# Patient Record
Sex: Male | Born: 1970 | Race: White | Hispanic: No | Marital: Married | Smoking: Never smoker
Health system: Southern US, Community
[De-identification: ages and names within clinical notes are randomized; demographics above are authoritative.]

## PROBLEM LIST (undated history)

## (undated) DIAGNOSIS — E785 Hyperlipidemia, unspecified: Secondary | ICD-10-CM

## (undated) DIAGNOSIS — M25569 Pain in unspecified knee: Secondary | ICD-10-CM

## (undated) DIAGNOSIS — I10 Essential (primary) hypertension: Secondary | ICD-10-CM

## (undated) DIAGNOSIS — B977 Papillomavirus as the cause of diseases classified elsewhere: Secondary | ICD-10-CM

## (undated) DIAGNOSIS — I2699 Other pulmonary embolism without acute cor pulmonale: Principal | ICD-10-CM

## (undated) HISTORY — PX: WISDOM TOOTH EXTRACTION: SHX21

## (undated) HISTORY — DX: Papillomavirus as the cause of diseases classified elsewhere: B97.7

## (undated) HISTORY — DX: Hyperlipidemia, unspecified: E78.5

## (undated) HISTORY — DX: Other pulmonary embolism without acute cor pulmonale: I26.99

## (undated) HISTORY — DX: Pain in unspecified knee: M25.569

## (undated) HISTORY — PX: HX WISDOM TEETH EXTRACTION: SHX21

## (undated) HISTORY — DX: Other pulmonary embolism without acute cor pulmonale (CMS HCC): I26.99

## (undated) HISTORY — DX: Essential (primary) hypertension: I10

---

## 2008-06-25 ENCOUNTER — Ambulatory Visit: Payer: Self-pay | Admitting: Family Medicine

## 2008-06-25 DIAGNOSIS — E785 Hyperlipidemia, unspecified: Secondary | ICD-10-CM | POA: Insufficient documentation

## 2008-06-25 DIAGNOSIS — J45991 Cough variant asthma: Secondary | ICD-10-CM | POA: Insufficient documentation

## 2008-06-25 LAB — CONVERTED CEMR LAB: LDL Cholesterol: 163 mg/dL

## 2008-06-27 LAB — CONVERTED CEMR LAB
Direct LDL: 163.2 mg/dL
HDL: 31.4 mg/dL — ABNORMAL LOW (ref >39.0)
Total CHOL/HDL Ratio: 6.8
Triglycerides: 105 mg/dL (ref 0–149)
VLDL: 21 mg/dL (ref 0–40)

## 2008-12-01 ENCOUNTER — Ambulatory Visit: Payer: Self-pay | Admitting: Family Medicine

## 2008-12-01 DIAGNOSIS — B977 Papillomavirus as the cause of diseases classified elsewhere: Secondary | ICD-10-CM | POA: Insufficient documentation

## 2008-12-01 DIAGNOSIS — M25569 Pain in unspecified knee: Secondary | ICD-10-CM | POA: Insufficient documentation

## 2008-12-01 DIAGNOSIS — L255 Unspecified contact dermatitis due to plants, except food: Secondary | ICD-10-CM | POA: Insufficient documentation

## 2009-04-21 ENCOUNTER — Ambulatory Visit: Payer: Self-pay | Admitting: Family Medicine

## 2009-04-21 DIAGNOSIS — J180 Bronchopneumonia, unspecified organism: Secondary | ICD-10-CM | POA: Insufficient documentation

## 2010-07-08 ENCOUNTER — Ambulatory Visit: Payer: Self-pay | Admitting: Family Medicine

## 2010-07-08 DIAGNOSIS — J069 Acute upper respiratory infection, unspecified: Secondary | ICD-10-CM | POA: Insufficient documentation

## 2010-09-19 ENCOUNTER — Ambulatory Visit
Admission: RE | Admit: 2010-09-19 | Discharge: 2010-09-19 | Payer: Self-pay | Source: Home / Self Care | Attending: Family Medicine | Admitting: Family Medicine

## 2010-09-19 LAB — CONVERTED CEMR LAB: Rapid Strep: NEGATIVE

## 2010-09-20 NOTE — Assessment & Plan Note (Signed)
Summary: SORE THROAT, HEAD CHEST CONT CYD   Vital Signs:  Patient profile:   40 year old male Height:      69 inches Weight:      226.75 pounds BMI:     33.61 Temp:     98.4 degrees F oral Pulse rate:   76 / minute Pulse rhythm:   regular BP sitting:   110 / 86  (left arm) Cuff size:   large  Vitals Entered By: Delilah Shan CMA Duncan Dull) (July 08, 2010 10:44 AM) CC: ST, head and chest congestion   History of Present Illness: 8 days of ST, facial pain, congestion.  Son with strep.  Pt was getting better but then ST increased.  Still with some facial pain.  Temp max 100.5, about 1.5 days ago.  No ear pain.  Minimal cough, some in AM, likely due to post nasal gtt.  Voice change.  No GI symptoms.    Used aleve occ for pain/fever.  Allergies (verified): 1)  ! Aspirin  Review of Systems       See HPI.  Otherwise negative.    Physical Exam  General:  GEN: nad, alert and oriented HEENT: mucous membranes moist, TM w/o erythema, nasal epithelium injected, OP with cobblestoning, sinuses not tender to palpation  NECK: supple w/o LA CV: rrr. PULM: ctab, no inc wob ABD: soft, +bs EXT: no edema    Impression & Recommendations:  Problem # 1:  STREP THROAT (ICD-034.0) RST pos.  Amoxil and supportive tx o/w.  follow up as needed.  He needs flu shot later this fall.  He was asking about PNA shot.  He doesn't have HTN/DM2/etc, but given his h/o PNA I advised him to d/w PMD at CPE.  he agreed.   His updated medication list for this problem includes:    Amoxicillin 875 Mg Tabs (Amoxicillin) .Marland Kitchen... 1 by mouth two times a day  Complete Medication List: 1)  Multivitamins Tabs (Multiple vitamin) .... Take 1 tablet by mouth once a day 2)  Vitamin C 500 Mg Tabs (Ascorbic acid) .... Take 1 tablet by mouth once a day 3)  Amoxicillin 875 Mg Tabs (Amoxicillin) .Marland Kitchen.. 1 by mouth two times a day  Patient Instructions: 1)  Use cough drops and gargle with warm salt water for your throat.   2)  Get  plenty of rest, drink lots of clear liquids, and use Tylenol or Ibuprofen for fever and comfort. Start the antibiotics today.  Take care.  Prescriptions: AMOXICILLIN 875 MG TABS (AMOXICILLIN) 1 by mouth two times a day  #20 x 0   Entered and Authorized by:   Crawford Givens MD   Signed by:   Crawford Givens MD on 07/08/2010   Method used:   [facility]ron[facility] to        [facility]  [address] [id number] (retail[facility]       [address]       [place], [place]  [address]       Ph: [phone] or [phone]       Fax: [phone]   RxID:   [id number]    Orders Added: 1)  Est. Patient Level III [36644]    Current Allergies (reviewed today): ! ASPIRIN  Laboratory Results  Date/Time Received: July 08, 2010 11:14 AM   Other Tests  Rapid Strep: positive

## 2010-09-28 NOTE — Assessment & Plan Note (Signed)
Summary: COUGH, CONGESTION, FEVER, SORE THROAT / LFW   Vital Signs:  Patient profile:   40 year old male Height:      69 inches Weight:      232.25 pounds BMI:     34.42 Temp:     98 degrees F oral Pulse rate:   84 / minute Pulse rhythm:   regular BP sitting:   122 / 88  (left arm) Cuff size:   large  Vitals Entered By: Delilah Shan CMA (AAMA) (September 19, 2010 9:08 AM) CC: Cough, congestion   History of Present Illness: prev RST pos and was treated.  Started antibiotics and got better after a few days.  Now with symptoms starting about 7 days ago. Chest is congested now.  ST is persistent.  H/o PNA in distant past.  +cough.  Voice change noted.  Achy.  No NAVDR.  Fever has resolved, none in 4-5 days.  Some post nasal gtt.  Mult sick contacts.   Allergies: 1)  ! Aspirin  Review of Systems       See HPI.  Otherwise negative.    Physical Exam  General:  GEN: nad, alert and oriented HEENT: mucous membranes moist, TM w/o erythema, nasal epithelium injected, OP with cobblestoning but no exudates NECK: supple w/o LA, not tender to palpation  CV: rrr. PULM: ctab, no inc wob, no wheeze, dry cough noted EXT: no edema    Impression & Recommendations:  Problem # 1:  URI (ICD-465.9) Likely viral.  ST and cough are the most troubling for patient.  See instructions.  In NAD and nontoxic.  Okay for outpatient follow up.  Call back, follow up as needed.  He agrees.  Orders: Rapid Strep (03474)  His updated medication list for this problem includes:    Hydrocodone-homatropine 5-1.5 Mg/73ml Syrp (Hydrocodone-homatropine) .Marland KitchenMarland KitchenMarland KitchenMarland Kitchen 5ml by mouth three times a day as needed for cough, sedation caution.  Complete Medication List: 1)  Multivitamins Tabs (Multiple vitamin) .... Take 1 tablet by mouth once a day 2)  Vitamin C 500 Mg Tabs (Ascorbic acid) .... Take 1 tablet by mouth once a day 3)  Nutraferrin  .... Once daily 4)  Hydrocodone-homatropine 5-1.5 Mg/62ml Syrp  (Hydrocodone-homatropine) .... 5ml by mouth three times a day as needed for cough, sedation caution.  Patient Instructions: 1)  Salt water gargles for your throat.   2)  Use the hycodan for the cough.   3)  Use nasal saline for you nasal congestion.  4)  Get plenty of rest, drink lots of clear liquids, and use Tylenol or Ibuprofen for fever and comfort.  5)  This should gradually get better.   Prescriptions: HYDROCODONE-HOMATROPINE 5-1.5 MG/5ML SYRP (HYDROCODONE-HOMATROPINE) 5ml by mouth three times a day as needed for cough, sedation caution.  #6oz x 0   Entered and Authorized by:   Crawford Givens MD   Signed by:   Crawford Givens MD on 09/19/2010   Method used:   Print then Give to Patient   RxID:   [id number]    Orders Added: 1)  Est. Patient Level III [18841] 2)  Rapid Strep [66063]    Current Allergies (reviewed today): ! ASPIRIN   Laboratory Results  Date/Time Received: September 19, 2010 9:25 AM   Other Tests  Rapid Strep: negative

## 2011-06-08 ENCOUNTER — Encounter: Payer: Self-pay | Admitting: Family Medicine

## 2011-06-08 ENCOUNTER — Ambulatory Visit (INDEPENDENT_AMBULATORY_CARE_PROVIDER_SITE_OTHER): Payer: BC Managed Care – PPO | Admitting: Family Medicine

## 2011-06-08 VITALS — BP 126/84 | HR 62 | Temp 98.5°F | Ht 69.0 in | Wt 218.8 lb

## 2011-06-08 DIAGNOSIS — J069 Acute upper respiratory infection, unspecified: Secondary | ICD-10-CM

## 2011-06-08 DIAGNOSIS — J029 Acute pharyngitis, unspecified: Secondary | ICD-10-CM

## 2011-06-08 LAB — POCT RAPID STREP A (OFFICE): Rapid Strep A Screen: NEGATIVE

## 2011-06-08 MED ORDER — GUAIFENESIN-CODEINE 100-10 MG/5ML PO SYRP: 5.0000 mL | ORAL_SOLUTION | Freq: Two times a day (BID) | ORAL | Status: AC | PRN

## 2011-06-08 MED ORDER — ALBUTEROL 90 MCG/ACT IN AERS: 2.0000 | INHALATION_SPRAY | Freq: Four times a day (QID) | RESPIRATORY_TRACT | Status: DC | PRN

## 2011-06-08 NOTE — Assessment & Plan Note (Signed)
Going on 1 wk.  Likely viral URTI. Supportive care. As mild wheezing, will treat with albuterol. cheratussin for cough at night. Update [place] if not improving as expected.

## 2011-06-08 NOTE — Progress Notes (Signed)
  Subjective:    Patient ID: Kerry Pierce, male    DOB: 18-Mar-1971, 40 y.o.   MRN: [id number]  HPI CC: cough  1 wk h/o deep cough, increased vit C.  Also with slight fever at beginning.  Then ST, drainage down back of throat started, last night starting to have pain below ears.  Wanted to get checked out.  Tmax 100.  Has tried hot teas and nasal spray ([facility] brand, but unsure what it was).  No headaches, sinus pressure pain, abd pain, n/v/d, rashes, myalgia or arthralgias.  Tends to get resp infection yearly.  Has had PNA in past, several times.  Has had flu in past as well.  No sick contacts at home, no smokers at home.  No h/o asthma, COPD.  ? Cough variant asthma.  Review of Systems Per HPI    Objective:   Physical Exam  Nursing note and vitals reviewed. Constitutional: He appears well-developed and well-nourished. No distress.  HENT:  Head: Normocephalic and atraumatic.  Right Ear: Hearing, tympanic membrane, external ear and ear canal normal.  Left Ear: Hearing, tympanic membrane, external ear and ear canal normal.  Nose: Nose normal. No mucosal edema or rhinorrhea.  Mouth/Throat: Uvula is midline, oropharynx is clear and moist and mucous membranes are normal. No oropharyngeal exudate.  Eyes: Conjunctivae and EOM are normal. Pupils are equal, round, and reactive to light. No scleral icterus.  Neck: Normal range of motion. Neck supple.  Cardiovascular: Normal rate, regular rhythm, normal heart sounds and intact distal pulses.   No murmur heard. Pulmonary/Chest: Effort normal. No respiratory distress. He has wheezes (mild). He has no rales.  Lymphadenopathy:    He has no cervical adenopathy.  Skin: Skin is warm and dry. No rash noted.  Psychiatric: He has a normal mood and affect.      Assessment & Plan:

## 2011-06-08 NOTE — Patient Instructions (Signed)
Sounds like you have a viral upper respiratory infection. Antibiotics are not needed for this.  Viral infections usually take 7-10 days to resolve.  The cough can last several weeks to go away. Use medication as prescribed: albuterol to help with cough, cheratussin for night time. Push fluids and plenty of rest. Please let us know if you are not improving as expected, or if you have high fevers (>101.5) or difficulty swallowing or worsening productive cough or shortness of breath. Call clinic with questions.  Good to see you today.

## 2011-12-14 ENCOUNTER — Ambulatory Visit: Payer: BC Managed Care – PPO | Admitting: Family Medicine

## 2011-12-14 ENCOUNTER — Encounter: Payer: Self-pay | Admitting: Family Medicine

## 2011-12-14 ENCOUNTER — Ambulatory Visit (INDEPENDENT_AMBULATORY_CARE_PROVIDER_SITE_OTHER): Payer: BC Managed Care – PPO | Admitting: Family Medicine

## 2011-12-14 VITALS — BP 124/80 | HR 64 | Temp 97.6°F | Wt 226.2 lb

## 2011-12-14 DIAGNOSIS — L255 Unspecified contact dermatitis due to plants, except food: Secondary | ICD-10-CM

## 2011-12-14 MED ORDER — PREDNISONE 20 MG PO TABS: ORAL_TABLET | ORAL | Status: DC

## 2011-12-14 NOTE — Patient Instructions (Signed)
Benadryl and oatmeal bath at night time. Look into sarna cream Prednisone course as well Let us know if not improving as expected.  Poison [employer] ivy is a inflammation of the skin (contact dermatitis) caused by touching the allergens on the leaves of the ivy plant following previous exposure to the plant. The rash usually appears 48 hours after exposure. The rash is usually bumps (papules) or blisters (vesicles) in a linear pattern. Depending on your own sensitivity, the rash may simply cause redness and itching, or it may also progress to blisters which may break open. These must be well cared for to prevent secondary bacterial (germ) infection, followed by scarring. Keep any open areas dry, clean, dressed, and covered with an antibacterial ointment if needed. The eyes may also get puffy. The puffiness is worst in the morning and gets better as the day progresses. This dermatitis usually heals without scarring, within 2 to 3 weeks without treatment. HOME CARE INSTRUCTIONS  Thoroughly wash with soap and water as soon as you have been exposed to poison ivy. You have about one half hour to remove the plant resin before it will cause the rash. This washing will destroy the oil or antigen on the skin that is causing, or will cause, the rash. Be sure to wash under your fingernails as any plant resin there will continue to spread the rash. Do not rub skin vigorously when washing affected area. Poison ivy cannot spread if no oil from the plant remains on your body. A rash that has progressed to weeping sores will not spread the rash unless you have not washed thoroughly. It is also important to wash any clothes you have been wearing as these may carry active allergens. The rash will return if you wear the unwashed clothing, even several days later. Avoidance of the plant in the future is the best measure. Poison ivy plant can be recognized by the number of leaves. Generally, poison ivy has three leaves with  flowering branches on a single stem. Diphenhydramine may be purchased over the counter and used as needed for itching. Do not drive with this medication if it makes you drowsy.Ask your caregiver about medication for children. SEEK MEDICAL CARE IF:  Open sores develop.   Redness spreads beyond area of rash.   You notice purulent (pus-like) discharge.   You have increased pain.   Other signs of infection develop (such as fever).  Document Released: 08/04/2000 Document Revised: 07/27/2011 Document Reviewed: 06/23/2009 [facility] Patient Information 2012 [place], [place].

## 2011-12-14 NOTE — Progress Notes (Signed)
  Subjective:    Patient ID: Kerry Pierce, male    DOB: [birth year], 41 y.o.   MRN: [id number]  HPI CC: skin rash  Bad reactions to poison ivy in past.  Going on 2 wks.  Mowed lawn for first time 2 wks ago.  Pruritic spreading rash on neck, abdomen, chest, arms, groin, anterior thighs.  Using analegsics (Calomine and hydrocortisone).  Has used prednisone in past.  Review of Systems Per HPI    Objective:   Physical Exam Erythematous pruritic papular and macular rash widespread - papules on left forearm, also significant and marked on anterior legs and groin, fading rash on right neck.    Assessment & Plan:

## 2011-12-14 NOTE — Assessment & Plan Note (Signed)
Discussed supportive care. Prednisone course prescribed. Update if not improving.

## 2013-10-23 ENCOUNTER — Ambulatory Visit (INDEPENDENT_AMBULATORY_CARE_PROVIDER_SITE_OTHER): Payer: BC Managed Care – PPO | Admitting: Internal Medicine

## 2013-10-23 ENCOUNTER — Encounter: Payer: Self-pay | Admitting: Internal Medicine

## 2013-10-23 VITALS — BP 126/88 | HR 82 | Temp 98.4°F | Wt 230.0 lb

## 2013-10-23 DIAGNOSIS — L237 Allergic contact dermatitis due to plants, except food: Secondary | ICD-10-CM

## 2013-10-23 DIAGNOSIS — L255 Unspecified contact dermatitis due to plants, except food: Secondary | ICD-10-CM

## 2013-10-23 MED ORDER — PREDNISONE 10 MG PO TABS: ORAL_TABLET | ORAL | Status: DC

## 2013-10-23 NOTE — Progress Notes (Signed)
Subjective:    Patient ID: Kerry Pierce, male    DOB: 10/14/1970, 43 y.o.   MRN: [id number]  HPI  Pt presents to the clinic today with c/o a rash. This started 3 days ago. The rash is on his upper and lower extremities. The rash is very itch. He has tried hydrocortisone cream and benadryl OTC with no improvement.  Review of Systems      Past Medical History  Diagnosis Date  . HLD (hyperlipidemia)   . Asthma     mild; cough variant  . 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 Pain in joint, lower leg     patello-femoral syndrome    Current Outpatient Prescriptions  Medication Sig Dispense Refill  . albuterol (PROVENTIL,VENTOLIN) 90 MCG/ACT inhaler Inhale 2 puffs into the lungs every 6 (six) hours as needed for wheezing.      . Multiple Vitamin (MULTIVITAMIN) tablet Take 1 tablet by mouth daily.        . NON FORMULARY 1 tablet daily. Nutraferrin       . vitamin C (ASCORBIC ACID) 500 MG tablet Take 500 mg by mouth daily.         No current facility-administered medications for this visit.    Allergies  Allergen Reactions  . Aspirin     REACTION: hives    Family History  Problem Relation Age of Onset  . Arthritis      family history  . Hypertension      family history  . Coronary artery disease      grandparents    History   Social History  . Marital Status: Married    Spouse Name: N/A    Number of Children: 3  . Years of Education: N/A   Occupational History  . Principal    Social History Main Topics  . Smoking status: Never Smoker   . Smokeless tobacco: Not on file  . Alcohol Use: No  . Drug Use: No  . Sexual Activity: Not on file   Other Topics Concern  . Not on file   Social History Narrative   From [employer] principal, [employer]      Wife; 3 kids      No regular exercise (<3 times/week)      Plays tennis 1/week     Constitutional: Denies fever, malaise, fatigue, headache  or abrupt weight changes.  Skin: Pt reports rash on arms and legs. Denies redness,  lesions or ulcercations.    No other specific complaints in a complete review of systems (except as listed in HPI above).  Objective:   Physical Exam   BP 126/88  Pulse 82  Temp(Src) 98.4 F (36.9 C) (Oral)  Wt 230 lb (104.327 kg)  SpO2 99% Wt Readings from Last 3 Encounters:  10/23/13 230 lb (104.327 kg)  12/14/11 226 lb 4 oz (102.626 kg)  06/08/11 218 lb 12 oz (99.224 kg)    General: Appears his stated age, well developed, well nourished in NAD. Skin: Multiple welts with erythematous bases note don bilateral arms and legs. Cardiovascular: Normal rate and rhythm. S1,S2 noted.  No murmur, rubs or gallops noted. No JVD or BLE edema. No carotid bruits noted. Pulmonary/Chest: Normal effort and positive vesicular breath sounds. No respiratory distress. No wheezes, rales or ronchi noted.      Lipid Panel     Component Value Date/Time   CHOL 215* 06/25/2008 1156  TRIG 105 06/25/2008 1156   HDL 31.4* 06/25/2008 1156   CHOLHDL 6.8 CALC 06/25/2008 1156   VLDL 21 06/25/2008 1156   LDLCALC 163 06/25/2008          Assessment & Plan:   Poison Ivy:  eRx for pred taper Continue benadryl as needed for itching  RTC as needed or if symptoms persist or worsen

## 2013-10-23 NOTE — Patient Instructions (Addendum)
Poison Ivy Poison ivy is a inflammation of the skin (contact dermatitis) caused by touching the allergens on the leaves of the ivy plant following previous exposure to the plant. The rash usually appears 48 hours after exposure. The rash is usually bumps (papules) or blisters (vesicles) in a linear pattern. Depending on your own sensitivity, the rash may simply cause redness and itching, or it may also progress to blisters which may break open. These must be well cared for to prevent secondary bacterial (germ) infection, followed by scarring. Keep any open areas dry, clean, dressed, and covered with an antibacterial ointment if needed. The eyes may also get puffy. The puffiness is worst in the morning and gets better as the day progresses. This dermatitis usually heals without scarring, within 2 to 3 weeks without treatment. HOME CARE INSTRUCTIONS  Thoroughly wash with soap and water as soon as you have been exposed to poison ivy. You have about one half hour to remove the plant resin before it will cause the rash. This washing will destroy the oil or antigen on the skin that is causing, or will cause, the rash. Be sure to wash under your fingernails as any plant resin there will continue to spread the rash. Do not rub skin vigorously when washing affected area. Poison ivy cannot spread if no oil from the plant remains on your body. A rash that has progressed to weeping sores will not spread the rash unless you have not washed thoroughly. It is also important to wash any clothes you have been wearing as these may carry active allergens. The rash will return if you wear the unwashed clothing, even several days later. Avoidance of the plant in the future is the best measure. Poison ivy plant can be recognized by the number of leaves. Generally, poison ivy has three leaves with flowering branches on a single stem. Diphenhydramine may be purchased over the counter and used as needed for itching. Do not drive with  this medication if it makes you drowsy.Ask your caregiver about medication for children. SEEK MEDICAL CARE IF:  Open sores develop.  Redness spreads beyond area of rash.  You notice purulent (pus-like) discharge.  You have increased pain.  Other signs of infection develop (such as fever). Document Released: 08/04/2000 Document Revised: 10/30/2011 Document Reviewed: 06/23/2009 [employer] Patient Information 2014 [employer].  

## 2013-10-23 NOTE — Progress Notes (Signed)
Pre visit review using our clinic review tool, if applicable. No additional management support is needed unless otherwise documented below in the visit note. 

## 2013-10-30 ENCOUNTER — Ambulatory Visit: Payer: BC Managed Care – PPO | Admitting: Internal Medicine

## 2013-10-30 ENCOUNTER — Telehealth: Payer: Self-pay

## 2013-10-30 NOTE — Telephone Encounter (Signed)
Pt left v/m;pt was seen 10/23/13 with poison ivy and pt finished prednisone 2 days ago but poison ivy still spreading onto hands. Spoke with pt and he scheduled appt today with Nicki Reaperegina Baity NP.

## 2014-05-12 LAB — ENTER/EDIT EXTERNAL COMMON LAB RESULTS
HCT: 40.5
HGB: 13.6
MONOCYTES: 11.4 %
PLATELET COUNT: 262
RBC: 4.77
WBC: 17.9

## 2014-05-13 ENCOUNTER — Ambulatory Visit: Payer: BC Managed Care – PPO | Admitting: Internal Medicine

## 2014-05-13 ENCOUNTER — Ambulatory Visit (INDEPENDENT_AMBULATORY_CARE_PROVIDER_SITE_OTHER): Payer: BC Managed Care – PPO | Admitting: Family Medicine

## 2014-05-13 ENCOUNTER — Encounter: Payer: Self-pay | Admitting: Family Medicine

## 2014-05-13 ENCOUNTER — Ambulatory Visit: Payer: BC Managed Care – PPO | Admitting: Family Medicine

## 2014-05-13 VITALS — BP 124/78 | HR 87 | Temp 98.6°F | Ht 68.5 in | Wt 224.5 lb

## 2014-05-13 DIAGNOSIS — I2699 Other pulmonary embolism without acute cor pulmonale: Secondary | ICD-10-CM

## 2014-05-13 DIAGNOSIS — Z7901 Long term (current) use of anticoagulants: Secondary | ICD-10-CM | POA: Insufficient documentation

## 2014-05-13 HISTORY — DX: Other pulmonary embolism without acute cor pulmonale: I26.99

## 2014-05-13 MED ORDER — ALBUTEROL SULFATE HFA 108 (90 BASE) MCG/ACT IN AERS: 2.0000 | INHALATION_SPRAY | Freq: Four times a day (QID) | RESPIRATORY_TRACT | Status: DC | PRN

## 2014-05-13 NOTE — Progress Notes (Signed)
Dr. Karleen Hampshire T. Rosann Gorum, MD, CAQ [facility] [address] [place], [address] Phone: [phone] Fax: [phone]  05/13/2014  Patient: Kerry Pierce, MRN: [id number], DOB: Jul 04, 1971, 43 y.o.  Primary Physician:  Hannah Beat, MD  Chief Complaint: Hospitalization Follow-up  Subjective:   Kerry Pierce is a 43 y.o. pleasant patient who presents with the following:  Transitional care note:  Date of Admission: 05/10/2014 Date of Discharge: 05/12/2014  Reason for admission: Bilateral pulmonary embolism with pulmonary infarction.  The patient and his wife report that he was in his usual good state of health until several days before admission at [facility]. He had some scapular pain in his back and he started to develop some pleuritic chest pain and pain with taking a deep breath on both the left and right side and he also developed some shortness of breath. He has not really been having any kind of cough or fever.  One week ago he thinks that he may have developed some left calf pain without any current swelling, erythema, it resolved within one day. He thought that he might have pulled a muscle in his leg in hindsight. He has had no extended travel. He has no family history of a hypercoagulable disorder. No family history of pulmonary embolism or DVT that he knows of.   He had a markedly elevated d-dimer and fibrinogen on admission to the hospital during his evaluation. Subsequently, they obtained a CT angiogram of the patient's chest. This showed numerous bilateral filling defects in the pulmonary arterial system. Occlusive embolus involving the arteries supplying the anterior segment of the left lower lobe posterior left lower lobe it is difficult to assess. Consolidation in this region. On the right there is occlusive thrombus and artery extending to the right apex. There is also partially occlusive thrombus in the arteries supplying  the anterior right middle lobe multiple lower lobe register partially occlusive to occlusive thrombus. There is consolidation in the inferior lingula, posterior left lower lobe and posterior lateral right lobe. There is a tiny left pleural effusion.  Additionally, there is a 7 mm pulmonary nodule in the right middle lobe. Radiology impression is bilateral pulmonary emboli with multifocal bilateral consolidation, suspicious for pulmonary infarction.  He was admitted to a monitored bed and anticoagulated with Lovenox. He was also placed on Coumadin.  Patient Active Problem List   Diagnosis Date Noted  . 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 Pulmonary infarction 05/13/2014    Priority: High  . Chronic anticoagulation 05/13/2014  . 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 HYPERLIPIDEMIA 06/25/2008  . 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 HLD (hyperlipidemia)   . Asthma     mild; cough variant  . 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 Pain in joint, lower leg     patello-femoral syndrome  . 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 Pulmonary infarction 05/13/2014   Past Surgical History  Procedure Laterality Date  . Wisdom tooth extraction     History   Social History  . Marital Status: Married    Spouse Name: N/A    Number of Children: 3  . Years of Education: N/A   Occupational History  . Principal    Social History Main Topics  . Smoking status: Never Smoker   . Smokeless tobacco: Not on file  .  Alcohol Use: No  . Drug Use: No  . Sexual Activity: Not on file   Other Topics Concern  . 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  Arthritis      family history  . Hypertension      family history  . Coronary artery disease      grandparents   Allergies  Allergen Reactions  . Aspirin     REACTION: hives   Medication list has been reviewed and updated.   GEN: As above. no fevers, chills. GI: No n/v/d, eating normally Pulm: + SOB Chest pain with breathing. Interactive and getting along well at home.  Otherwise, ROS is as per the HPI. Objective:   BP 124/78  Pulse 87  Temp(Src) 98.6 F (37 C) (Oral)  Ht 5' 8.5" (1.74 m)  Wt 224 lb 8 oz (101.833 kg)  BMI 33.63 kg/m2  SpO2 94%   GEN: WDWN, NAD, Non-toxic, A & O x 3 HEENT: Atraumatic, Normocephalic. Neck supple. No masses, No LAD. Ears and Nose: No external deformity. CV: RRR, No M/G/R. No JVD. No thrill. No extra heart sounds. PULM: CTA B, no wheezes, crackles, rhonchi. No retractions. No resp. distress. No accessory muscle use. EXTR: No c/c/e NEURO Normal gait.  PSYCH: Normally interactive. Conversant. Not depressed or anxious appearing.  Calm demeanor.   Laboratory and Imaging Data: All [facility] studies and images reviewed.  Assessment and Plan:   Embolism, pulmonary with infarction - Plan: Ambulatory referral to Pulmonology  Embolism, pulmonary with infarction, 05/10/2014  Pulmonary infarction  Chronic anticoagulation  The patient has a hypercoagulable workup is pending at [facility]. The length of his anticoagulation will depend on this hypercoagulable workup. He would need at least a full 12 months of anticoagulation with Coumadin.  Given the extent of this pulmonary embolism with infarction of both lungs, I really would like to have pulmonology involved with the management of his lung status over the upcoming months. Thankfully, they are going to see him in one week's time. I appreciate their assistance.  He will be coming tomorrow to establish with our Coumadin clinic in our office.  I did my best answer all the patient's  questions and his wife's questions.  Patient seen within 1 day of discharge face to face.  New Prescriptions   ALBUTEROL (PROVENTIL HFA;VENTOLIN HFA) 108 (90 BASE) MCG/ACT INHALER    Inhale 2 puffs into the lungs every 6 (six) hours as needed for wheezing or shortness of breath.   Orders Placed This Encounter  Procedures  . Ambulatory referral to Pulmonology    Signed,  Karleen Hampshire T. Naya Ilagan, MD   Patient's Medications  New Prescriptions   ALBUTEROL (PROVENTIL HFA;VENTOLIN HFA) 108 (90 BASE) MCG/ACT INHALER    Inhale 2 puffs into the lungs every 6 (six) hours as needed for wheezing or shortness of breath.  Previous Medications   ENOXAPARIN (LOVENOX) 100 MG/ML INJECTION    1 mL every 12 (twelve) hours.   MULTIPLE VITAMIN (MULTIVITAMIN) TABLET    Take 1 tablet by mouth daily.     OXYCODONE (OXY IR/ROXICODONE) 5 MG IMMEDIATE RELEASE TABLET    Take 1 tablet by mouth every 4 (four) hours as needed.  VITAMIN C (ASCORBIC ACID) 500 MG TABLET    Take 500 mg by mouth daily.     WARFARIN (COUMADIN) 5 MG TABLET    Take 1 tablet by mouth at bedtime.  Modified Medications   No medications on file  Discontinued Medications   ALBUTEROL (PROVENTIL,VENTOLIN) 90 MCG/ACT INHALER    Inhale 2 puffs into the lungs every 6 (six) hours as needed for wheezing.   NON FORMULARY    1 tablet daily. Nutraferrin    PREDNISONE (DELTASONE) 10 MG TABLET    Take 3 tabs on days 1-2, take 2 tabs on days 3-4, take 1 tab on days 5-6

## 2014-05-13 NOTE — Patient Instructions (Signed)
F/u tomorrow to establish Coumadin clinic visit with [facility].   REFERRALS TO SPECIALISTS, SPECIAL TESTS (MRI, CT, ULTRASOUNDS)  [facility] or [facility] will help you. ASK CHECK-IN FOR HELP.  Imaging / Special Testing referrals sometimes can be done same day if EMERGENCY, but others can take 2 or 3 days to get an appointment. Starting in 2015, many of the new Medicare plans and Obamacare plans take much longer.   Specialist appointment times vary a great deal, based on their schedule / openings. -- Some specialists have very long wait times. (Example. Dermatology. Multiple months  for non-cancer)

## 2014-05-13 NOTE — Progress Notes (Signed)
Pre visit review using our clinic review tool, if applicable. No additional management support is needed unless otherwise documented below in the visit note. 

## 2014-05-14 ENCOUNTER — Ambulatory Visit (INDEPENDENT_AMBULATORY_CARE_PROVIDER_SITE_OTHER): Payer: BC Managed Care – PPO | Admitting: Family Medicine

## 2014-05-14 DIAGNOSIS — Z7901 Long term (current) use of anticoagulants: Secondary | ICD-10-CM

## 2014-05-14 LAB — POCT INR: INR: 1.2

## 2014-05-18 ENCOUNTER — Ambulatory Visit (INDEPENDENT_AMBULATORY_CARE_PROVIDER_SITE_OTHER): Payer: BC Managed Care – PPO | Admitting: *Deleted

## 2014-05-18 DIAGNOSIS — Z7901 Long term (current) use of anticoagulants: Secondary | ICD-10-CM

## 2014-05-18 LAB — POCT INR: INR: 2

## 2014-05-20 ENCOUNTER — Telehealth: Payer: Self-pay

## 2014-05-20 ENCOUNTER — Ambulatory Visit (INDEPENDENT_AMBULATORY_CARE_PROVIDER_SITE_OTHER): Payer: BC Managed Care – PPO | Admitting: Internal Medicine

## 2014-05-20 ENCOUNTER — Encounter: Payer: Self-pay | Admitting: Internal Medicine

## 2014-05-20 VITALS — BP 128/84 | HR 77 | Ht 69.0 in | Wt 227.0 lb

## 2014-05-20 DIAGNOSIS — R0602 Shortness of breath: Secondary | ICD-10-CM

## 2014-05-20 DIAGNOSIS — R0609 Other forms of dyspnea: Secondary | ICD-10-CM

## 2014-05-20 DIAGNOSIS — R911 Solitary pulmonary nodule: Secondary | ICD-10-CM | POA: Insufficient documentation

## 2014-05-20 DIAGNOSIS — R0683 Snoring: Secondary | ICD-10-CM

## 2014-05-20 DIAGNOSIS — R0989 Other specified symptoms and signs involving the circulatory and respiratory systems: Secondary | ICD-10-CM

## 2014-05-20 DIAGNOSIS — I2699 Other pulmonary embolism without acute cor pulmonale: Secondary | ICD-10-CM

## 2014-05-20 NOTE — Assessment & Plan Note (Signed)
Pulmonary embolism-non-massive Most likely provoke embolism secondary to immobilization. Etiology likely lower extremity clot that migrated to the pulmonary arterial system, given the history of prolonged driving over a two-week period. This is a first time thromboembolic event, in a hemodynamically stable patient with normal RV strain-recommended therapy would be 3 months of anticoagulation. Current anticoagulation regiment-warfarin (manage per primary care physician) Patient is a candidate for Xarelto, this was discussed with the patient, since he is on warfarin he will further discuss with his primary care physician. Genetic testing for thromboembolic disease initiated during his inpatient hospitalization, primary care physician currently awaiting results. Based on results may need followup with hematology/oncology. Recommend repeat CT chest scan in 6 months (this will also evaluate the right middle lobe 7 mm nodule).

## 2014-05-20 NOTE — Progress Notes (Signed)
Date: 05/20/2014  MRN# [id number] Kerry Pierce 1971-04-05  Referring Physician:   Rishit Burkhalter is a 43 y.o. old male seen in consultation for pulmonary embolism  CC:  Chief Complaint  Patient presents with  . Advice Only    Hospitalized at [facility] last week for pulmonary embolisms.  Pt c/o SOB with exertion, some soreness in chest with heavy breathing.     HPI:  43 year old male seen in consultation for recent diagnosis of pulmonary embolism in right middle lobe nodule. He is accompanied by his wife today. She presented to [facility] on 05/10/2014 with a complaint of chest pain and acute worsening shortness of breath with chest pain radiating to the back. At that time he had a cardiac workup done in the CTA of his chest which showed that he had a right upper lobe, right middle lobe, left lower lobe pulmonary embolism; no saddle embolism, no main artery embolism. Incidentally, he was also found to have a 7 mm right middle lobe nodule. During hospitalization he was treated with Lovenox, and transition to warfarin. He was discharged on 05/12/2014, had followup with his primary care physician for further recommended him to pulmonary. He is currently on warfarin for his pulmonary embolism. Per review of records he also carries a diagnosis of cough variant asthma.   History from patient as follow: He stated in June of 2015 him and his family took a two-week vacation, this involves driving to [place], [place], other [place] states, in [place]: During that two-week period the longest time he had was 8 hours continuously. He stated 2-3 weeks prior to being admitted to the hospital he started noticing worsening shortness of breath especially when playing tennis, this was a new finding for him. Stated that after his two-week vacation he did have some mild left leg pain but it eventually went away, he initially thought this was due to a pulled muscle. He is a never smoker ,  currently employed as a principal at a local high school, only medication he takes is a multivitamin (prior to hospital admission), no clotting disorders in his family. During his hospitalization he had a 2-D echo performed which showed no RV strain, EF 55-60%. Patient also had lower extremity Doppler ultrasound, which showed no clots.  Obstructive Sleep Apnea Screening The patient was screened with the STOP-BANG questionnaire. >3 positive responses is considered a positive screen  Snoring = yes Tiredness = no Observed Apnea= yes Pressure (HTN) = no BMI >35 = NO (33.5) Age > 50 = NO Neck >17" = yes Gender (male)= yes  Total: 4/8   Screen: Positive     PMHX:   Past Medical History  Diagnosis Date  . HLD (hyperlipidemia)   . Asthma     mild; cough variant  . 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 Pain in joint, lower leg     patello-femoral syndrome  . 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 Wisdom tooth extraction     Family Hx:  Family History  Problem Relation Age of Onset  . Arthritis      family history  . Hypertension      family history  . Coronary artery disease      grandparents  . Cancer Maternal Grandmother     lung   Social Hx:   History  Substance Use Topics  .  Smoking status: Never Smoker   . Smokeless tobacco: Never Used  . Alcohol Use: No   Medication:   Current Outpatient Rx  Name  Route  Sig  Dispense  Refill  . albuterol (PROVENTIL HFA;VENTOLIN HFA) 108 (90 BASE) MCG/ACT inhaler   Inhalation   Inhale 2 puffs into the lungs every 6 (six) hours as needed for wheezing or shortness of breath.   1 Inhaler   2   . Multiple Vitamin (MULTIVITAMIN) tablet   Oral   Take 1 tablet by mouth daily.           Marland Kitchen oxyCODONE (OXY IR/ROXICODONE) 5 MG immediate release tablet   Oral   Take 1 tablet  by mouth every 4 (four) hours as needed.         . vitamin C (ASCORBIC ACID) 500 MG tablet   Oral   Take 500 mg by mouth daily.           Marland Kitchen warfarin (COUMADIN) 5 MG tablet   Oral   Take 1 tablet by mouth at bedtime. 1.5 tablets on Tuesday, Thursday, Saturday, Sunday, and 1 tablet Monday, Wednesday, Friday             Allergies:  Aspirin  Review of Systems: Gen:  Denies  fever, sweats, chills HEENT: Denies blurred vision, double vision, ear pain, eye pain, hearing loss, nose bleeds, sore throat Cvc:  No dizziness, chest pain or heaviness Resp:   Denies cough or sputum porduction. Admits to mild shortness of breath Gi: Denies swallowing difficulty, stomach pain, nausea or vomiting, diarrhea, constipation, bowel incontinence Gu:  Denies bladder incontinence, burning urine Ext:   No Joint pain, stiffness or swelling Skin: No skin rash, easy bruising or bleeding or hives Endoc:  No polyuria, polydipsia , polyphagia or weight change Psych: No depression, insomnia or hallucinations  Other:  All other systems negative  Physical Examination:   VS: BP 128/84  Pulse 77  Ht 5\' 9"  (1.753 m)  Wt 227 lb (102.967 kg)  BMI 33.51 kg/m2  SpO2 97%  General Appearance: No distress  Neuro:without focal findings, mental status, speech normal, alert and oriented, cranial nerves 2-12 intact, reflexes normal and symmetric, sensation grossly normal  HEENT: PERRLA, EOM intact, no ptosis, no other lesions noticed; Mallampati =3 Pulmonary: normal breath sounds., diaphragmatic excursion normal.No wheezing, No rales;   Sputum Production:   CardiovascularNormal S1,S2.  No m/r/g.  Abdominal aorta pulsation normal.    Abdomen: Benign, Soft, non-tender, No masses, hepatosplenomegaly, No lymphadenopathy Renal:  No costovertebral tenderness  GU:  No performed at this time. Endoc: No evident thyromegaly, no signs of acromegaly or Cushing features Skin:   warm, no rashes, no ecchymosis  Extremities:  normal, no cyanosis, clubbing, no edema, warm with normal capillary refill. Other findings:   Labs results:   Rad results:  2-View Chest 05/10/14  bilateral basilar infiltrate and there is a patchy left base infiltrate. No definite pleural effusion. Bibasal infiltrates, possible pneumonia, clinical correlation advised.  CTA Chest 05/10/14 Bilateral pulmonary emboli, with various degrees of collision. Multifocal bilateral consolidation, suspicious for pulmonary infarction. Tiny left pleural effusion. Right middle lobe 7 mm pulmonary nodule. No large central emboli. There is occlusive embolus involving artery supplying the anterior segment of the left lower lobe. There is occlusive thrombus in artery extending to right apex. There is also partially occlusive thrombus in the artery supplying the anterior right middle lobe. Multiple lower lobe branches show partially occlusive thrombus. There is consolidation in  the inferior lingula, posterior left lower lobe, posterior and lateral right lower lobe.  2-D echo 05/10/2014 EF 67% there is borderline concentric left ventricular hypertrophy. Overall left ventricular systolic function is normal with EF of 60-65%. No regional wall motion abnormalities were noted. Right ventricle is normal in size and function. Left atrium is normal in size. Right atrium is normal in size and function. Mild mitral regurgitation is present. Mild tricuspid regurgitation is present.   US Lower Extermities 05/11/14 Bilateral lower extremity Dopplers-negative for DVT    Assessment and Plan: Embolism, pulmonary with infarction, 05/10/2014 Pulmonary embolism-non-massive Most likely provoke embolism secondary to immobilization. Etiology likely lower extremity clot that migrated to the pulmonary arterial system, given the history of prolonged driving over a two-week period. This is a first time thromboembolic event, in a hemodynamically stable patient with normal RV strain-recommended  therapy would be 3 months of anticoagulation. Current anticoagulation regiment-warfarin (manage per primary care physician) Patient is a candidate for Xarelto, this was discussed with the patient, since he is on warfarin he will further discuss with his primary care physician. Genetic testing for thromboembolic disease initiated during his inpatient hospitalization, primary care physician currently awaiting results. Based on results may need followup with hematology/oncology. Recommend repeat CT chest scan in 6 months (this will also evaluate the right middle lobe 7 mm nodule).  Pulmonary infarction Secondary to pulmonary embolism. See management as stated above  SOB (shortness of breath) on exertion Secondary to pulmonary embolism, continue with anticoagulation. May return to work in one week. Limited duties and exercise for the first one of anticoagulation.  Solitary pulmonary nodule Most likely sequela of his pulmonary embolism. He is noted to have a embolism in the area of the right middle lobe where the nodule is present. Low risk for bronchogenic carcinoma, followup CAT scan with contrast in 6 months; this will also evaluate for any residual embolic disease.  Snoring Patient had a positive screen for obstructive sleep apnea. STOP BANG = 4/8 Spoke to patient about PSG, he is in agreement for it. Recommended nighttime sleep study (PSG) 1 month prior to followup with pulmonary. He is not a candidate for home sleep study given his recent pulmonary embolism event.     Updated Medication List Outpatient Encounter Prescriptions as of 05/20/2014  Medication Sig  . albuterol (PROVENTIL HFA;VENTOLIN HFA) 108 (90 BASE) MCG/ACT inhaler Inhale 2 puffs into the lungs every 6 (six) hours as needed for wheezing or shortness of breath.  . Multiple Vitamin (MULTIVITAMIN) tablet Take 1 tablet by mouth daily.    Marland Kitchen. oxyCODONE (OXY IR/ROXICODONE) 5 MG immediate release tablet Take 1 tablet by mouth  every 4 (four) hours as needed.  . vitamin C (ASCORBIC ACID) 500 MG tablet Take 500 mg by mouth daily.    Marland Kitchen. warfarin (COUMADIN) 5 MG tablet Take 1 tablet by mouth at bedtime. 1.5 tablets on Tuesday, Thursday, Saturday, Sunday, and 1 tablet Monday, Wednesday, Friday  . [DISCONTINUED] enoxaparin (LOVENOX) 100 MG/ML injection 1 mL every 12 (twelve) hours.    Orders for this visit: No orders of the defined types were placed in this encounter.     Thank  you for the consultation and for allowing [facility] to assist in the care of your patient. Our recommendations are noted above.  Please contact us if we can be of further service.   Stephanie AcreVishal Tres Grzywacz, MD [facility] Office Number: [phone]

## 2014-05-20 NOTE — Patient Instructions (Signed)
We will schedule a sleep study.  Our office will contact you to have this scheduled. We will repeat your CT scan in 6 months.    Follow up in 3 months, call us if you need us sooner.

## 2014-05-20 NOTE — Assessment & Plan Note (Signed)
Patient had a positive screen for obstructive sleep apnea. STOP BANG = 4/8 Spoke to patient about PSG, he is in agreement for it. Recommended nighttime sleep study (PSG) 1 month prior to followup with pulmonary. He is not a candidate for home sleep study given his recent pulmonary embolism event.

## 2014-05-20 NOTE — Assessment & Plan Note (Signed)
Secondary to pulmonary embolism. See management as stated above

## 2014-05-20 NOTE — Progress Notes (Deleted)
   Subjective:    Patient ID: Kerry LimaJeremy Rausch, male    DOB: [birth year], 43 y.o.   MRN: [id number]  HPI    Review of Systems     Objective:   Physical Exam        Assessment & Plan:

## 2014-05-20 NOTE — Telephone Encounter (Signed)
Pt left v/m; pt wanted to know if Dr Patsy Lageropland received genetic testing results when pt was recently in the hospital for pulmonary embolism;genetic testing was done to see if pts clotting was genetic related.Please advise.

## 2014-05-20 NOTE — Assessment & Plan Note (Signed)
Secondary to pulmonary embolism, continue with anticoagulation. May return to work in one week. Limited duties and exercise for the first one of anticoagulation.

## 2014-05-20 NOTE — Assessment & Plan Note (Signed)
Most likely sequela of his pulmonary embolism. He is noted to have a embolism in the area of the right middle lobe where the nodule is present. Low risk for bronchogenic carcinoma, followup CAT scan with contrast in 6 months; this will also evaluate for any residual embolic disease.

## 2014-05-21 ENCOUNTER — Other Ambulatory Visit: Payer: Self-pay | Admitting: Family Medicine

## 2014-05-21 DIAGNOSIS — I2699 Other pulmonary embolism without acute cor pulmonale: Secondary | ICD-10-CM

## 2014-05-21 DIAGNOSIS — D682 Hereditary deficiency of other clotting factors: Secondary | ICD-10-CM

## 2014-05-21 NOTE — Telephone Encounter (Signed)
Heterozygous for factor 2. Discussed with him on the phone, and I would like to involve hematology about decisions for length of treatment given this information.

## 2014-05-25 ENCOUNTER — Ambulatory Visit (INDEPENDENT_AMBULATORY_CARE_PROVIDER_SITE_OTHER): Payer: BC Managed Care – PPO | Admitting: *Deleted

## 2014-05-25 DIAGNOSIS — Z7901 Long term (current) use of anticoagulants: Secondary | ICD-10-CM

## 2014-05-25 LAB — POCT INR: INR: 2.8

## 2014-05-26 ENCOUNTER — Ambulatory Visit: Payer: Self-pay

## 2014-06-01 ENCOUNTER — Telehealth: Payer: Self-pay

## 2014-06-01 MED ORDER — WARFARIN SODIUM 5 MG PO TABS: 5.0000 mg | ORAL_TABLET | Freq: Every day | ORAL | Status: DC

## 2014-06-01 NOTE — Telephone Encounter (Signed)
Pt left v/m; pt is running out of warfarin and request cb; pt also has additional questions about quantity of warfarin. Pt request cb.

## 2014-06-15 ENCOUNTER — Ambulatory Visit (INDEPENDENT_AMBULATORY_CARE_PROVIDER_SITE_OTHER): Payer: BC Managed Care – PPO | Admitting: *Deleted

## 2014-06-15 DIAGNOSIS — Z7901 Long term (current) use of anticoagulants: Secondary | ICD-10-CM

## 2014-06-15 LAB — POCT INR: INR: 3.4

## 2014-06-21 ENCOUNTER — Ambulatory Visit: Payer: Self-pay

## 2014-07-06 ENCOUNTER — Ambulatory Visit: Payer: BC Managed Care – PPO

## 2014-07-06 ENCOUNTER — Ambulatory Visit (INDEPENDENT_AMBULATORY_CARE_PROVIDER_SITE_OTHER): Payer: BC Managed Care – PPO | Admitting: *Deleted

## 2014-07-06 ENCOUNTER — Other Ambulatory Visit (INDEPENDENT_AMBULATORY_CARE_PROVIDER_SITE_OTHER): Payer: BC Managed Care – PPO

## 2014-07-06 DIAGNOSIS — Z23 Encounter for immunization: Secondary | ICD-10-CM

## 2014-07-06 DIAGNOSIS — Z7901 Long term (current) use of anticoagulants: Secondary | ICD-10-CM

## 2014-07-07 LAB — PROTIME-INR
INR: 1.9 — ABNORMAL HIGH (ref <1.50)
Prothrombin Time: 21.8 seconds — ABNORMAL HIGH (ref 11.6–15.2)

## 2014-07-20 ENCOUNTER — Other Ambulatory Visit (INDEPENDENT_AMBULATORY_CARE_PROVIDER_SITE_OTHER): Payer: BC Managed Care – PPO

## 2014-07-20 DIAGNOSIS — Z5181 Encounter for therapeutic drug level monitoring: Secondary | ICD-10-CM

## 2014-07-21 LAB — PROTIME-INR
INR: 2.4 ratio — ABNORMAL HIGH (ref 0.8–1.0)
PROTHROMBIN TIME: 25.7 s — AB (ref 9.6–13.1)

## 2014-07-31 ENCOUNTER — Other Ambulatory Visit: Payer: Self-pay | Admitting: Family Medicine

## 2014-07-31 DIAGNOSIS — D682 Hereditary deficiency of other clotting factors: Secondary | ICD-10-CM

## 2014-07-31 DIAGNOSIS — I2699 Other pulmonary embolism without acute cor pulmonale: Secondary | ICD-10-CM

## 2014-07-31 DIAGNOSIS — T81718A Complication of other artery following a procedure, not elsewhere classified, initial encounter: Principal | ICD-10-CM

## 2014-08-03 ENCOUNTER — Other Ambulatory Visit (INDEPENDENT_AMBULATORY_CARE_PROVIDER_SITE_OTHER): Payer: BC Managed Care – PPO

## 2014-08-03 DIAGNOSIS — I2699 Other pulmonary embolism without acute cor pulmonale: Secondary | ICD-10-CM

## 2014-08-03 DIAGNOSIS — T81718A Complication of other artery following a procedure, not elsewhere classified, initial encounter: Secondary | ICD-10-CM

## 2014-08-03 DIAGNOSIS — D682 Hereditary deficiency of other clotting factors: Secondary | ICD-10-CM

## 2014-08-04 LAB — PROTIME-INR
INR: 3.2 ratio — ABNORMAL HIGH (ref 0.8–1.0)
Prothrombin Time: 33.9 s — ABNORMAL HIGH (ref 9.6–13.1)

## 2014-08-05 ENCOUNTER — Other Ambulatory Visit: Payer: BC Managed Care – PPO

## 2014-08-31 ENCOUNTER — Ambulatory Visit (INDEPENDENT_AMBULATORY_CARE_PROVIDER_SITE_OTHER): Payer: BLUE CROSS/BLUE SHIELD | Admitting: Family Medicine

## 2014-08-31 DIAGNOSIS — Z7901 Long term (current) use of anticoagulants: Secondary | ICD-10-CM

## 2014-08-31 LAB — POCT INR: INR: 1.9

## 2014-09-03 ENCOUNTER — Telehealth: Payer: Self-pay | Admitting: Family Medicine

## 2014-09-03 ENCOUNTER — Ambulatory Visit (INDEPENDENT_AMBULATORY_CARE_PROVIDER_SITE_OTHER): Payer: BLUE CROSS/BLUE SHIELD | Admitting: Internal Medicine

## 2014-09-03 ENCOUNTER — Encounter: Payer: Self-pay | Admitting: Internal Medicine

## 2014-09-03 VITALS — BP 140/80 | HR 73 | Temp 98.3°F | Wt 229.0 lb

## 2014-09-03 DIAGNOSIS — I10 Essential (primary) hypertension: Secondary | ICD-10-CM

## 2014-09-03 MED ORDER — LOSARTAN POTASSIUM 50 MG PO TABS: 50.0000 mg | ORAL_TABLET | Freq: Every day | ORAL | Status: DC

## 2014-09-03 NOTE — Telephone Encounter (Signed)
Pt has appt today with Dr. Alphonsus SiasLetvak.

## 2014-09-03 NOTE — Progress Notes (Signed)
Pre visit review using our clinic review tool, if applicable. No additional management support is needed unless otherwise documented below in the visit note. 

## 2014-09-03 NOTE — Assessment & Plan Note (Signed)
BP Readings from Last 3 Encounters:  09/03/14 140/80  05/20/14 128/84  05/13/14 124/78   Repeat on right 144/100 High at home and doesn't feel right No clear way to connect recent PE and slight hypercoag state with his increased BP---esp on the coumadin Will start low dose losartan since symptomatic Consider renal artery ultrasound---no reason to consider pheo, etc  Info on DASH Discussed increased exercise

## 2014-09-03 NOTE — Patient Instructions (Signed)
DASH Eating Plan °DASH stands for "Dietary Approaches to Stop Hypertension." The DASH eating plan is a healthy eating plan that has been shown to reduce high blood pressure (hypertension). Additional health benefits may include reducing the risk of type 2 diabetes mellitus, heart disease, and stroke. The DASH eating plan may also help with weight loss. °WHAT DO I NEED TO KNOW ABOUT THE DASH EATING PLAN? °For the DASH eating plan, you will follow these general guidelines: °· Choose foods with a percent daily value for sodium of less than 5% (as listed on the food label). °· Use salt-free seasonings or herbs instead of table salt or sea salt. °· Check with your health care provider or pharmacist before using salt substitutes. °· Eat lower-sodium products, often labeled as "lower sodium" or "no salt added." °· Eat fresh foods. °· Eat more vegetables, fruits, and low-fat dairy products. °· Choose whole grains. Look for the word "whole" as the first word in the ingredient list. °· Choose fish and skinless chicken or turkey more often than red meat. Limit fish, poultry, and meat to 6 oz (170 g) each day. °· Limit sweets, desserts, sugars, and sugary drinks. °· Choose heart-healthy fats. °· Limit cheese to 1 oz (28 g) per day. °· Eat more home-cooked food and less restaurant, buffet, and fast food. °· Limit fried foods. °· Cook foods using methods other than frying. °· Limit canned vegetables. If you do use them, rinse them well to decrease the sodium. °· When eating at a restaurant, ask that your food be prepared with less salt, or no salt if possible. °WHAT FOODS CAN I EAT? °Seek help from a dietitian for individual calorie needs. °Grains °Whole grain or whole wheat bread. Brown rice. Whole grain or whole wheat pasta. Quinoa, bulgur, and whole grain cereals. Low-sodium cereals. Corn or whole wheat flour tortillas. Whole grain cornbread. Whole grain crackers. Low-sodium crackers. °Vegetables °Fresh or frozen vegetables  (raw, steamed, roasted, or grilled). Low-sodium or reduced-sodium tomato and vegetable juices. Low-sodium or reduced-sodium tomato sauce and paste. Low-sodium or reduced-sodium canned vegetables.  °Fruits °All fresh, canned (in natural juice), or frozen fruits. °Meat and Other Protein Products °Ground beef (85% or leaner), grass-fed beef, or beef trimmed of fat. Skinless chicken or turkey. Ground chicken or turkey. Pork trimmed of fat. All fish and seafood. Eggs. Dried beans, peas, or lentils. Unsalted nuts and seeds. Unsalted canned beans. °Dairy °Low-fat dairy products, such as skim or 1% milk, 2% or reduced-fat cheeses, low-fat ricotta or cottage cheese, or plain low-fat yogurt. Low-sodium or reduced-sodium cheeses. °Fats and Oils °Tub margarines without trans fats. Light or reduced-fat mayonnaise and salad dressings (reduced sodium). Avocado. Safflower, olive, or canola oils. Natural peanut or almond butter. °Other °Unsalted popcorn and pretzels. °The items listed above may not be a complete list of recommended foods or beverages. Contact your dietitian for more options. °WHAT FOODS ARE NOT RECOMMENDED? °Grains °White bread. White pasta. White rice. Refined cornbread. Bagels and croissants. Crackers that contain trans fat. °Vegetables °Creamed or fried vegetables. Vegetables in a cheese sauce. Regular canned vegetables. Regular canned tomato sauce and paste. Regular tomato and vegetable juices. °Fruits °Dried fruits. Canned fruit in light or heavy syrup. Fruit juice. °Meat and Other Protein Products °Fatty cuts of meat. Ribs, chicken wings, bacon, sausage, bologna, salami, chitterlings, fatback, hot dogs, bratwurst, and packaged luncheon meats. Salted nuts and seeds. Canned beans with salt. °Dairy °Whole or 2% milk, cream, half-and-half, and cream cheese. Whole-fat or sweetened yogurt. Full-fat   cheeses or blue cheese. Nondairy creamers and whipped toppings. Processed cheese, cheese spreads, or cheese  curds. °Condiments °Onion and garlic salt, seasoned salt, table salt, and sea salt. Canned and packaged gravies. Worcestershire sauce. Tartar sauce. Barbecue sauce. Teriyaki sauce. Soy sauce, including reduced sodium. Steak sauce. Fish sauce. Oyster sauce. Cocktail sauce. Horseradish. Ketchup and mustard. Meat flavorings and tenderizers. Bouillon cubes. Hot sauce. Tabasco sauce. Marinades. Taco seasonings. Relishes. °Fats and Oils °Butter, stick margarine, lard, shortening, ghee, and bacon fat. Coconut, palm kernel, or palm oils. Regular salad dressings. °Other °Pickles and olives. Salted popcorn and pretzels. °The items listed above may not be a complete list of foods and beverages to avoid. Contact your dietitian for more information. °WHERE CAN I FIND MORE INFORMATION? °National Heart, Lung, and Blood Institute: www.nhlbi.nih.gov/health/health-topics/topics/dash/ °Document Released: 07/27/2011 Document Revised: 12/22/2013 Document Reviewed: 06/11/2013 °ExitCare® Patient Information ©2015 [employer]. This information is not intended to replace advice given to you by your health care provider. Make sure you discuss any questions you have with your health care provider. ° °

## 2014-09-03 NOTE — Telephone Encounter (Signed)
Patient Name: Kerry LimaJEREMY Voth DOB: June 20, 1971 Nurse Assessment Nurse: Yetta BarreJones, RN, Miranda Date/Time (Eastern Time): 09/03/2014 1:01:40 PM Confirm and document reason for call. If symptomatic, describe symptoms. ---Caller states he has an appt this afternoon for his BP. His BP is higher than normal. BP 191/111 now. Has the patient traveled out of the country within the last 30 days? ---Not Applicable Does the patient require triage? ---Yes Related visit to physician within the last 2 weeks? ---Yes Does the PT have any chronic conditions? (i.e. diabetes, asthma, etc.) ---Yes List chronic conditions. ---hx of PE (on Coumadin) Guidelines Guideline Title Affirmed Question Affirmed Notes High Blood Pressure BP # 180/110 Final Disposition User See Physician within 24 Hours LorimorJones, Charity fundraiserN, Marshall & IlsleyMiranda

## 2014-09-03 NOTE — Progress Notes (Signed)
Subjective:    Patient ID: Kerry Pierce, male    DOB: [birth year], 44 y.o.   MRN: [id number]  HPI Here due to concerns that his blood pressure is going up  Checks at home Had been 120/80 to 150/90s Then to eye doctor 3 days ago--- 154/102 Hasn't been feeling great--some lethargy Had school nurse check 191/111 today so appt made Rested at home till appt now  On warfarin for the PE  Had very slight pressure sensation on chest when BP up No SOB "Heat" feeling in face No headache but slight pain in temples  Current Outpatient Prescriptions on File Prior to Visit  Medication Sig Dispense Refill  . albuterol (PROVENTIL HFA;VENTOLIN HFA) 108 (90 BASE) MCG/ACT inhaler Inhale 2 puffs into the lungs every 6 (six) hours as needed for wheezing or shortness of breath. 1 Inhaler 2  . Multiple Vitamin (MULTIVITAMIN) tablet Take 1 tablet by mouth daily.      . vitamin C (ASCORBIC ACID) 500 MG tablet Take 500 mg by mouth daily.      Marland Kitchen. warfarin (COUMADIN) 5 MG tablet Take 1 tablet (5 mg total) by mouth at bedtime. 1.5 tablets on Tuesday, Thursday, Saturday, Sunday, and 1 tablet Monday, Wednesday, Friday 60 tablet 3   No current facility-administered medications on file prior to visit.    Allergies  Allergen Reactions  . Aspirin     REACTION: hives- childhood reaction    Past Medical History  Diagnosis Date  . HLD (hyperlipidemia)   . Asthma     mild; cough variant  . 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 Pain in joint, lower leg     patello-femoral syndrome  . 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 Wisdom tooth extraction      Family History  Problem Relation Age of Onset  . Arthritis      family history  . Hypertension      family history  . Coronary artery disease      grandparents  . Cancer Maternal Grandmother     lung     History   Social History  . Marital Status: Married    Spouse Name: N/A    Number of Children: 3  . Years of Education: N/A   Occupational History  . Principal    Social History Main Topics  . Smoking status: Never Smoker   . Smokeless tobacco: Never Used  . Alcohol Use: No  . Drug Use: No  . Sexual Activity: Not on file   Other Topics Concern  . Not on file   [place] History Narrative   [place]      School principal, [employer]      Wife; 3 kids      No regular exercise (<3 times/week)      Plays tennis 1/week   Review of Systems Did have minor abnormality in coagulation testing--- had hematology eval Sleeping okay usually Appetite okay No actual flushing--though wife thought his face was a little red yesterday No diarrhea    Objective:   Physical Exam  Constitutional: He appears well-developed and well-nourished. No distress.  Eyes:  Fundi normal  Neck: Normal range of motion. Neck supple. No thyromegaly present.  Cardiovascular: Normal rate and regular rhythm.  Exam reveals no gallop.   No murmur heard. Pulmonary/Chest: Effort normal and breath sounds normal. No respiratory distress.  He has no wheezes. He has no rales.  Musculoskeletal: He exhibits no edema.  Lymphadenopathy:    He has no cervical adenopathy.  Psychiatric: He has a normal mood and affect. His behavior is normal.          Assessment & Plan:

## 2014-09-16 ENCOUNTER — Telehealth: Payer: Self-pay | Admitting: Internal Medicine

## 2014-09-16 DIAGNOSIS — I2699 Other pulmonary embolism without acute cor pulmonale: Secondary | ICD-10-CM

## 2014-09-16 NOTE — Telephone Encounter (Signed)
Per 05/20/14 OV: Patient Instructions       We will schedule a sleep study.  Our office will contact you to have this scheduled. We will repeat your CT scan in 6 months.    Follow up in 3 months, call us if you need us sooner.  --   ATC fast busy signal x3 wcb

## 2014-09-17 ENCOUNTER — Ambulatory Visit: Payer: Self-pay | Admitting: Hematology and Oncology

## 2014-09-17 NOTE — Telephone Encounter (Signed)
lmtcb x1 

## 2014-09-17 NOTE — Telephone Encounter (Signed)
Spoke with pt. Advised him of the below information. ROV has been scheduled for 10/05/14 at 3:45pm. Order will be placed for CT to be done in March. Nothing further was needed.

## 2014-09-18 LAB — CBC CANCER CENTER
BASOS PCT: 1 %
Basophil #: 0.1 x10 3/mm (ref 0.0–0.1)
EOS ABS: 0 x10 3/mm (ref 0.0–0.7)
EOS PCT: 0.5 %
HCT: 44 % (ref 40.0–52.0)
HGB: 14.7 g/dL (ref 13.0–18.0)
LYMPHS PCT: 24.7 %
Lymphocyte #: 2.2 x10 3/mm (ref 1.0–3.6)
MCH: 28.2 pg (ref 26.0–34.0)
MCHC: 33.5 g/dL (ref 32.0–36.0)
MCV: 84 fL (ref 80–100)
Monocyte #: 0.9 x10 3/mm (ref 0.2–1.0)
Monocyte %: 10 %
NEUTROS ABS: 5.7 x10 3/mm (ref 1.4–6.5)
NEUTROS PCT: 63.8 %
PLATELETS: 265 x10 3/mm (ref 150–440)
RBC: 5.21 10*6/uL (ref 4.40–5.90)
RDW: 14.3 % (ref 11.5–14.5)
WBC: 8.9 x10 3/mm (ref 3.8–10.6)

## 2014-09-18 LAB — COMPREHENSIVE METABOLIC PANEL
AST: 30 U/L (ref 15–37)
Albumin: 4.1 g/dL (ref 3.4–5.0)
Alkaline Phosphatase: 73 U/L (ref 46–116)
Anion Gap: 4 — ABNORMAL LOW (ref 7–16)
BILIRUBIN TOTAL: 0.6 mg/dL (ref 0.2–1.0)
BUN: 12 mg/dL (ref 7–18)
CREATININE: 1.11 mg/dL (ref 0.60–1.30)
Calcium, Total: 8.5 mg/dL (ref 8.5–10.1)
Chloride: 104 mmol/L (ref 98–107)
Co2: 31 mmol/L (ref 21–32)
EGFR (Non-African Amer.): >60
GLUCOSE: 89 mg/dL (ref 65–99)
Osmolality: 277 (ref 275–301)
Potassium: 4.2 mmol/L (ref 3.5–5.1)
SGPT (ALT): 33 U/L (ref 14–63)
SODIUM: 139 mmol/L (ref 136–145)
TOTAL PROTEIN: 7.6 g/dL (ref 6.4–8.2)

## 2014-09-18 LAB — URINALYSIS, COMPLETE
Bacteria: NONE SEEN
Bilirubin,UR: NEGATIVE
Blood: NEGATIVE
Glucose,UR: NEGATIVE mg/dL (ref 0–75)
Ketone: NEGATIVE
Leukocyte Esterase: NEGATIVE
Nitrite: NEGATIVE
Ph: 6 (ref 4.5–8.0)
Protein: NEGATIVE
RBC,UR: 7 /HPF (ref 0–5)
Specific Gravity: 1.021 (ref 1.003–1.030)
Squamous Epithelial: NONE SEEN
WBC UR: <1 /HPF (ref 0–5)

## 2014-09-21 ENCOUNTER — Ambulatory Visit: Payer: Self-pay | Admitting: Hematology and Oncology

## 2014-09-28 ENCOUNTER — Other Ambulatory Visit: Payer: BLUE CROSS/BLUE SHIELD

## 2014-09-30 ENCOUNTER — Ambulatory Visit (INDEPENDENT_AMBULATORY_CARE_PROVIDER_SITE_OTHER): Payer: BLUE CROSS/BLUE SHIELD | Admitting: Family Medicine

## 2014-09-30 ENCOUNTER — Encounter: Payer: Self-pay | Admitting: Family Medicine

## 2014-09-30 ENCOUNTER — Other Ambulatory Visit (INDEPENDENT_AMBULATORY_CARE_PROVIDER_SITE_OTHER): Payer: BLUE CROSS/BLUE SHIELD

## 2014-09-30 VITALS — BP 100/68 | HR 72 | Temp 98.1°F | Ht 69.0 in | Wt 227.5 lb

## 2014-09-30 DIAGNOSIS — Z7901 Long term (current) use of anticoagulants: Secondary | ICD-10-CM

## 2014-09-30 DIAGNOSIS — I2699 Other pulmonary embolism without acute cor pulmonale: Secondary | ICD-10-CM

## 2014-09-30 DIAGNOSIS — I10 Essential (primary) hypertension: Secondary | ICD-10-CM

## 2014-09-30 LAB — POCT INR: INR: 1.8

## 2014-09-30 MED ORDER — LOSARTAN POTASSIUM 50 MG PO TABS: 50.0000 mg | ORAL_TABLET | Freq: Every day | ORAL | Status: DC

## 2014-09-30 NOTE — Progress Notes (Signed)
Pre visit review using our clinic review tool, if applicable. No additional management support is needed unless otherwise documented below in the visit note. 

## 2014-09-30 NOTE — Progress Notes (Signed)
Dr. Karleen HampshireSpencer T. Kaedynce Tapp, MD, CAQ [facility] [address] [place], [id number] Phone: [phone] Fax: [phone]  09/30/2014  Patient: Kerry Pierce, MRN: [id number], DOB: 1970/09/29, 44 y.o.  Primary Physician:  Hannah BeatSpencer Ryen Heitmeyer, MD  Chief Complaint: Follow-up  Subjective:   Kerry Pierce is a 44 y.o. very pleasant male patient who presents with the following:  3/18, f/u CT scan of chest.  F/u pulmonologist and Heme. Hopefully, he can get off of Coumadin.  Initial plan was 6 months.  He is tolerating his blood pressure medication fine.  He is also had a wake up, and he is eating much better, watching his salt, and he is exercising a whole lot more.  Overall, he feels very well.  No complaints at all.  Past Medical History, Surgical History, Social History, Family History, Problem List, Medications, and Allergies have been reviewed and updated if relevant.   GEN: No acute illnesses, no fevers, chills. GI: No n/v/d, eating normally Pulm: No SOB Interactive and getting along well at home.  Otherwise, ROS is as per the HPI.  Objective:   BP 100/68 mmHg  Pulse 72  Temp(Src) 98.1 F (36.7 C) (Oral)  Ht 5\' 9"  (1.753 m)  Wt 227 lb 8 oz (103.193 kg)  BMI 33.58 kg/m2  GEN: WDWN, NAD, Non-toxic, A & O x 3 HEENT: Atraumatic, Normocephalic. Neck supple. No masses, No LAD. Ears and Nose: No external deformity. CV: RRR, No M/G/R. No JVD. No thrill. No extra heart sounds. PULM: CTA B, no wheezes, crackles, rhonchi. No retractions. No resp. distress. No accessory muscle use. EXTR: No c/c/e NEURO Normal gait.  PSYCH: Normally interactive. Conversant. Not depressed or anxious appearing.  Calm demeanor.   Laboratory and Imaging Data: Results for orders placed or performed in visit on 09/30/14  POCT INR  Result Value Ref Range   INR 1.8      Assessment and Plan:   Essential hypertension  Chronic anticoagulation  Embolism,  pulmonary with infarction, 05/10/2014  Blood pressure is doing well.  Continue with medications.  If he continues to lose weight, and exercise, as possibly could get off of them.  INR at 1.8 today.  Increase Coumadin dosing as below and recheck INR in 2 weeks.  Will follow pulmonary and hematology recommendations.  Patient Instructions  Coumadin dosing: 7.5 mg Every day EXCEPT 5 mg on Tuesday and Thursday  Recheck PT/INR in 2 weeks     Signed,  Kimmora Risenhoover T. Jye Fariss, MD   Patient's Medications  New Prescriptions   No medications on file  Previous Medications   ALBUTEROL (PROVENTIL HFA;VENTOLIN HFA) 108 (90 BASE) MCG/ACT INHALER    Inhale 2 puffs into the lungs every 6 (six) hours as needed for wheezing or shortness of breath.   MULTIPLE VITAMIN (MULTIVITAMIN) TABLET    Take 1 tablet by mouth daily.     VITAMIN C (ASCORBIC ACID) 500 MG TABLET    Take 500 mg by mouth daily.     WARFARIN (COUMADIN) 5 MG TABLET    Take 1 tablet (5 mg total) by mouth at bedtime. 1.5 tablets on Tuesday, Thursday, Saturday, Sunday, and 1 tablet Monday, Wednesday, Friday  Modified Medications   Modified Medication Previous Medication   LOSARTAN (COZAAR) 50 MG TABLET losartan (COZAAR) 50 MG tablet      Take 1 tablet (50 mg total) by mouth daily.    Take 1 tablet (50 mg total) by mouth daily.  Discontinued Medications  No medications on file

## 2014-09-30 NOTE — Patient Instructions (Signed)
Coumadin dosing: 7.5 mg Every day EXCEPT 5 mg on Tuesday and Thursday  Recheck PT/INR in 2 weeks

## 2014-10-01 ENCOUNTER — Other Ambulatory Visit: Payer: BLUE CROSS/BLUE SHIELD

## 2014-10-05 ENCOUNTER — Telehealth: Payer: Self-pay | Admitting: *Deleted

## 2014-10-05 ENCOUNTER — Ambulatory Visit: Payer: BLUE CROSS/BLUE SHIELD | Admitting: Internal Medicine

## 2014-10-05 NOTE — Telephone Encounter (Signed)
Pt rescheduled his f/u to November 11, 2014. Pt did not get sleep study done. He is scheduled 11/06/14 for CT scan.

## 2014-10-19 ENCOUNTER — Ambulatory Visit: Payer: BLUE CROSS/BLUE SHIELD | Admitting: Internal Medicine

## 2014-11-06 ENCOUNTER — Encounter: Payer: Self-pay | Admitting: Internal Medicine

## 2014-11-06 ENCOUNTER — Ambulatory Visit: Payer: Self-pay | Admitting: Internal Medicine

## 2014-11-06 ENCOUNTER — Other Ambulatory Visit: Payer: Self-pay | Admitting: Internal Medicine

## 2014-11-06 DIAGNOSIS — I2699 Other pulmonary embolism without acute cor pulmonale: Secondary | ICD-10-CM

## 2014-11-11 ENCOUNTER — Ambulatory Visit (INDEPENDENT_AMBULATORY_CARE_PROVIDER_SITE_OTHER): Payer: BLUE CROSS/BLUE SHIELD | Admitting: Internal Medicine

## 2014-11-11 ENCOUNTER — Encounter (INDEPENDENT_AMBULATORY_CARE_PROVIDER_SITE_OTHER): Payer: Self-pay

## 2014-11-11 VITALS — BP 110/78 | HR 65 | Temp 98.0°F | Ht 69.0 in | Wt 222.2 lb

## 2014-11-11 DIAGNOSIS — R911 Solitary pulmonary nodule: Secondary | ICD-10-CM | POA: Diagnosis not present

## 2014-11-11 DIAGNOSIS — G479 Sleep disorder, unspecified: Secondary | ICD-10-CM | POA: Diagnosis not present

## 2014-11-11 DIAGNOSIS — I2699 Other pulmonary embolism without acute cor pulmonale: Secondary | ICD-10-CM | POA: Diagnosis not present

## 2014-11-11 DIAGNOSIS — G4733 Obstructive sleep apnea (adult) (pediatric): Secondary | ICD-10-CM | POA: Insufficient documentation

## 2014-11-11 NOTE — Patient Instructions (Addendum)
Follow up with Dr. Dema SeverinMungal in 2 months - home sleep study testing prior to follow up - may stop anticoagulation once cleared by Heme\Onc (Dr. Merlene Pullingorcoran) and PMD - your PE has completely resolved on CT Chest.

## 2014-11-11 NOTE — Assessment & Plan Note (Addendum)
Obstructive Sleep Apnea Screening The patient was screened with the STOP-BANG questionnaire. >3 positive responses is considered a positive screen  Snoring = yes Tiredness = no Observed Apnea= yes Pressure (HTN) = YES BMI >35 = NO (32.8) Age > 50 = NO Neck >17" = yes Gender (male)= yes  Total: 5/8 Screen: Positive  Plan: - home sleep study testing - weight loss and healthy diet.

## 2014-11-11 NOTE — Assessment & Plan Note (Signed)
Pulmonary embolism-non-massive-now resolved on current CT chest Most likely provoke embolism secondary to immobilization. Etiology likely lower extremity clot that migrated to the pulmonary arterial system, given the history of prolonged driving over a two-week period. This was a first time thromboembolic event, in a hemodynamically stable patient with no RV strain. 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 From a pulmonary standpoint, anticoagulation can be stopped, however given that he might be carrier for prothrombin gene mutation, hematology oncology is following him closely and will decide on cessation of anticoagulation after followup visit.  Plan: -Followup with hematology oncology for decision on stopping anticoagulation.

## 2014-11-11 NOTE — Assessment & Plan Note (Signed)
Most likely sequela of his pulmonary embolism. He is noted to have a embolism in the area of the right middle lobe where the nodule is present. Low risk for bronchogenic carcinoma, followup CAT scan with no increase in size or characteristics.  Plan: -No significant risk factors for malignancy, will repeat CT chest with contrast in one year

## 2014-11-11 NOTE — Progress Notes (Signed)
MRN# [id number] Kerry Pierce 1971/08/12   CC: Chief Complaint  Patient presents with  . Follow-up    Pt reports he is feeling good. He is concerned about CT scan.      Brief History: 05/21/15 HPI 44 year old male seen in consultation for recent diagnosis of pulmonary embolism in right middle lobe nodule. He is accompanied by his wife today. She presented to [facility] on 05/10/2014 with a complaint of chest pain and acute worsening shortness of breath with chest pain radiating to the back. At that time he had a cardiac workup done in the CTA of his chest which showed that he had a right upper lobe, right middle lobe, left lower lobe pulmonary embolism; no saddle embolism, no main artery embolism. Incidentally, he was also found to have a 7 mm right middle lobe nodule. During hospitalization he was treated with Lovenox, and transition to warfarin. He was discharged on 05/12/2014, had followup with his primary care physician for further recommended him to pulmonary. He is currently on warfarin for his pulmonary embolism. Per review of records he also carries a diagnosis of cough variant asthma.   History from patient as follow: He stated in June of 2015 him and his family took a two-week vacation, this involves driving to [place], [place], other [place] states, in [place]: During that two-week period the longest time he had was 8 hours continuously. He stated 2-3 weeks prior to being admitted to the hospital he started noticing worsening shortness of breath especially when playing tennis, this was a new finding for him. Stated that after his two-week vacation he did have some mild left leg pain but it eventually went away, he initially thought this was due to a pulled muscle. He is a never smoker , currently employed as a principal at a local high school, only medication he takes is a multivitamin (prior to hospital admission), no clotting disorders in his family. During his  hospitalization he had a 2-D echo performed which showed no RV strain, EF 55-60%. Patient also had lower extremity Doppler ultrasound, which showed no clots.  Plan - started on Warfarin by PMD,repeat CT Chest in 6 months.    Events since last clinic visit: Presents today for a follow up visit of his bilateral PE, had CT Chest with contrast done on 11/06/14. Patient states overall he is doing well, he has been followed by hematology oncology, see note below. No complaints of bleeding episodes, further pulmonary embolisms, a DVT type symptoms. At his last visit a sleep study was recommended, however patient stated that he wanted to try to lose some more weight before he decided and the sleep study. Since his last visit he has been diagnosed with high blood pressure and is currently being treated for that. Review of hematoma notes suggest that he may have a mild genetic component to his PE hence the use of warfarin for treating his recent pulmonary embolus.   Heme\Onc Visit on 1\29\16 The patient is a 44 year old gentleman with a history of bilateral pulmonary emboli on 05/10/2014.  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  Work-up was negative for protein C, protein S, ATIII deficiency, Factor V Leiden, and a lupus anticoagulant.  He has no family history of thrombosis.  Three months prior to the event he had a long car ride.  Chest CT on 05/10/2014 also revealed a 7 mm right middle lobe pulmonary nodule.  He does not smoke.  Initial urinalysis revealed hematuria (1+ blood,  5-10 RBCs per high powered field).  Symptomatically, he denies complaints.  He is on Coumadin.  INR has ranged between 2.3-2.4.  Exam is unremarkable.  Plan:   1)  Continue Coumadin  INR goal 2-3.  Minimum treatment 6 months. 2)  Follow-up 6 month chest CT ordered for 11/08/2014. 3)  Urinalysis- ensure hematuria has resolved.  If not, referral to urology and consideration of CT scan. 4)  RTC 1 week after  chest CT for MD assessment, labs (D-dimer), and decision regarding length of treatment for Coumadin.  PMHX:   Past Medical History  Diagnosis Date  . HLD (hyperlipidemia)   . Asthma     mild; cough variant  . 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 Pain in joint, lower leg     patello-femoral syndrome  . 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 Wisdom tooth extraction     Family Hx:  Family History  Problem Relation Age of Onset  . Arthritis      family history  . Hypertension      family history  . Coronary artery disease      grandparents  . Cancer Maternal Grandmother     lung   Social Hx:   History  Substance Use Topics  . Smoking status: Never Smoker   . Smokeless tobacco: Never Used  . Alcohol Use: No   Medication:   Current Outpatient Rx  Name  Route  Sig  Dispense  Refill  . albuterol (PROVENTIL HFA;VENTOLIN HFA) 108 (90 BASE) MCG/ACT inhaler   Inhalation   Inhale 2 puffs into the lungs every 6 (six) hours as needed for wheezing or shortness of breath.   1 Inhaler   2   . losartan (COZAAR) 50 MG tablet   Oral   Take 1 tablet (50 mg total) by mouth daily.   90 tablet   3   . Multiple Vitamin (MULTIVITAMIN) tablet   Oral   Take 1 tablet by mouth daily.           . vitamin C (ASCORBIC ACID) 500 MG tablet   Oral   Take 500 mg by mouth daily.           Marland Kitchen warfarin (COUMADIN) 5 MG tablet   Oral   Take 1 tablet (5 mg total) by mouth at bedtime. 1.5 tablets on Tuesday, Thursday, Saturday, Sunday, and 1 tablet Monday, Wednesday, Friday   60 tablet   3      Review of Systems: Gen:  Denies  fever, sweats, chills HEENT: Denies blurred vision, double vision, ear pain, eye pain, hearing loss, nose bleeds, sore throat Cvc:  No dizziness, chest pain or heaviness Resp:   Denies cough or sputum  porduction, shortness of breath Gi: Denies swallowing difficulty, stomach pain, nausea or vomiting, diarrhea, constipation, bowel incontinence Gu:  Denies bladder incontinence, burning urine Ext:   No Joint pain, stiffness or swelling Skin: No skin rash, easy bruising or bleeding or hives Endoc:  No polyuria, polydipsia , polyphagia or weight change Psych: No depression, insomnia or hallucinations  Other:  All other systems negative  Allergies:  Aspirin  Physical Examination:  VS: BP 110/78 mmHg  Pulse 65  Temp(Src) 98 F (36.7 C) (Oral)  Ht  (1.753 m)  Wt 222 lb 3.2 oz (100.789 kg)  BMI 32.80 kg/m2  SpO2 98%  General Appearance: No distress  Neuro: EXAM: without focal findings, mental status, speech normal, alert and oriented, cranial nerves 2-12 grossly normal  HEENT: PERRLA, EOM intact, no ptosis, no other lesions noticed Pulmonary:Exam: normal breath sounds., diaphragmatic excursion normal.No wheezing, No rales   Cardiovascular:@ Exam:  Normal S1,S2.  No m/r/g.     Abdomen:Exam: Benign, Soft, non-tender, No masses  Skin:   warm, no rashes, no ecchymosis  Extremities: normal, no cyanosis, clubbing, no edema, warm with normal capillary refill.   Labs results:  BMP No results found for: NA, K, CL, CO2, GLUCOSE, BUN, CREATININE   CBC No flowsheet data found.   Rad results: (The following images and results were reviewed by Dr. Dema Severin). PROCEDURE: KCT - KCT ANGIOGRAPHY CHEST W FOR PE  - Nov 06 2014  8:51AM   CLINICAL DATA:  History of pulmonary embolus and pulmonary nodule  EXAM: CT ANGIOGRAPHY CHEST WITH CONTRAST  TECHNIQUE: Multidetector CT imaging of the chest was performed using the standard protocol during bolus administration of intravenous contrast. Multiplanar CT image reconstructions and MIPs were obtained to evaluate the vascular anatomy. CONTRAST:  85 mL Omnipaque 350  COMPARISON:  CTA chest 05/10/2014.  FINDINGS: Previously-seen pulmonary emboli  have resolved. Pulmonary arterial opacification is satisfactory. There are no focal filling defects on today's exam. The heart size is normal. A 6 mm nodule on the right middle lobe is stable. Of note, this nodule is along the minor fissure and likely represents a subpleural lymph node. No other focal nodule, mass, or airspace disease is present.  Limited imaging the abdomen is unremarkable.  The bone windows demonstrate mild endplate degenerative change without focal lytic or blastic lesion. No significant pleural or pericardial effusion is present.  Previously seen airspace opacification has cleared with the exception of but minimal scarring at the lingula.  Limited imaging the upper abdomen is unremarkable.  Review of the MIP images confirms the above findings.   IMPRESSION: 1. Resolution of previous pulmonary emboli. 2. Airspace resolution with the exception of minimal residual disease in the lingula, likely scarring. 3. Stable 6 mm pleural-based nodule in the right middle lobe. If the patient is at high risk for bronchogenic carcinoma, follow-up chest CT at 6-12 months is recommended. If the patient is at low risk for bronchogenic carcinoma, follow-up chest CT at 12 months is recommended. This recommendation follows the consensus statement: Guidelines for Management of Small Pulmonary Nodules Detected on CT Scans: A Statement from the Fleischner Society as published in Radiology 2005;237:395-400. Re-evaluation of the lingular disease can be performed at that time as well.     Assessment and Plan: 44 year old male followup for pulmonary embolism solitary pulmonary nodule. Sleep disturbance Obstructive Sleep Apnea Screening The patient was screened with the STOP-BANG questionnaire. >3 positive responses is considered a positive screen  Snoring = yes Tiredness = no Observed Apnea= yes Pressure (HTN) = YES BMI >35 = NO (32.8) Age > 50 = NO Neck >17" = yes Gender  (male)= yes  Total: 5/8 Screen: Positive  Plan: - home sleep study testing - weight loss and healthy diet.    Solitary pulmonary nodule Most likely sequela of his pulmonary embolism. He is noted to have a embolism in the area of the right middle lobe where the nodule is present. Low risk for bronchogenic carcinoma, followup CAT scan with no increase in size or characteristics.  Plan: -No significant risk factors for malignancy, will repeat CT chest with contrast in one year  Embolism, pulmonary with infarction, 05/10/2014 Pulmonary embolism-non-massive-now resolved on current CT chest Most likely provoke embolism secondary to immobilization. Etiology likely lower extremity clot that migrated to the pulmonary arterial system, given the history of prolonged driving over a two-week period. This was a first time thromboembolic event, in a hemodynamically stable patient with no RV strain. 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 From a pulmonary standpoint, anticoagulation can be stopped, however given that he might be carrier for prothrombin gene mutation, hematology oncology is following him closely and will decide on cessation of anticoagulation after followup visit.  Plan: -Followup with hematology oncology for decision on stopping anticoagulation.     Updated Medication List Outpatient Encounter Prescriptions as of 11/11/2014  Medication Sig  . albuterol (PROVENTIL HFA;VENTOLIN HFA) 108 (90 BASE) MCG/ACT inhaler Inhale 2 puffs into the lungs every 6 (six) hours as needed for wheezing or shortness of breath.  . losartan (COZAAR) 50 MG tablet Take 1 tablet (50 mg total) by mouth daily.  . Multiple Vitamin (MULTIVITAMIN) tablet Take 1 tablet by mouth daily.    . vitamin C (ASCORBIC ACID) 500 MG tablet Take 500 mg by mouth daily.     Marland Kitchen. warfarin (COUMADIN) 5 MG tablet Take 1 tablet (5 mg total) by mouth at bedtime. 1.5 tablets on Tuesday, Thursday, Saturday, Sunday, and 1 tablet Monday, Wednesday, Friday    Orders for this visit: No orders of the defined types were placed in this encounter.    Thank  you for the visitation and for allowing  [facility] to assist in the care of your patient. Our recommendations are noted above.  Please contact us if we can be of further service.  Stephanie AcreVishal Velora Horstman, MD [facility] Office Number: [phone]

## 2014-11-23 ENCOUNTER — Ambulatory Visit
Admit: 2014-11-23 | Disposition: A | Discharge status: Home / Self Care | Payer: Self-pay | Attending: Hematology and Oncology | Admitting: Hematology and Oncology

## 2014-11-23 LAB — URINALYSIS, COMPLETE
Bacteria: NONE SEEN
Bilirubin,UR: NEGATIVE
Glucose,UR: NEGATIVE mg/dL (ref 0–75)
Ketone: NEGATIVE
Leukocyte Esterase: NEGATIVE
Nitrite: NEGATIVE
Ph: 6 (ref 4.5–8.0)
Protein: NEGATIVE
RBC,UR: 6 /HPF (ref 0–5)
Specific Gravity: 1.02 (ref 1.003–1.030)
Squamous Epithelial: NONE SEEN
WBC UR: <1 /HPF (ref 0–5)

## 2014-11-23 LAB — D-DIMER(ARMC): D-Dimer: 181 ng/ml

## 2014-11-30 ENCOUNTER — Telehealth: Payer: Self-pay | Admitting: Internal Medicine

## 2014-11-30 NOTE — Telephone Encounter (Signed)
lmtcb X1 for pt  

## 2014-11-30 NOTE — Telephone Encounter (Signed)
From Rhonda: [employer] denied Home sleep study. Please advise if you want to order an in lab study-  If so, please place order. Pt is aware [employer] denied hst.

## 2014-11-30 NOTE — Telephone Encounter (Signed)
Dr. Dema SeverinMungal - Pt's home sleep study was denied by insurance, would you like to set up at lab?  Please advise.

## 2014-11-30 NOTE — Telephone Encounter (Signed)
I am ok with the sleep lab, but the patient is the one who requested the home sleep study. Contact him, if he's willing to come into the sleep lab then we can do an order for it.

## 2014-12-01 NOTE — Telephone Encounter (Signed)
Spoke with pt, states that he wants to look into how much this will cost before he commits to the study.  He will call us back when he wants to schedule the sleep study.   Nothing further needed at this time.  Forwarding to Dr. Dema SeverinMungal to make him aware.

## 2015-01-04 ENCOUNTER — Telehealth: Payer: Self-pay | Admitting: *Deleted

## 2015-01-04 NOTE — Telephone Encounter (Signed)
Rose, Please look back at visit in Oct 2015 that has bad debt.  I don't see why it wasn't paid when several others of the same kind were paid around that date.  Then, please call pt's wife.  Cliffton AstersJoanna Grobe (spouse) [phone] Thank you, Hansel StarlingAdrienne

## 2015-01-04 NOTE — Telephone Encounter (Signed)
Patient' wife is calling regarding co-pays for Coumadin clinic visits.  She states the insurance company says there should not have a co-pay since the patient did not see the doctor.  Mrs. Kerry Pierce has spoken to [facility] billing department regarding this but has not been able to get any answers.  She would like to speak to Dr. Patsy Lageropland.

## 2015-01-04 NOTE — Telephone Encounter (Signed)
Hansel StarlingAdrienne, I spoke to Mrs. Ilene QuaCordova - she wanted to confirm the coumadin clinic office visit charges from when Mr. Ilene QuaCordova was on Coumadin.   There were times when he saw Carlena SaxBlair in the coumadin clinic and other times when he went directly to the lab.  Apparently there was a co-pay and 65 dollar charge.  It looks like there is a level 1 office visit associated with the encounter.   I would appreciate it if you could call her and go over it some.   Electronically Signed  By: Hannah BeatSpencer Vaniah Chambers, MD On: 01/04/2015 4:49 PM

## 2015-01-05 NOTE — Telephone Encounter (Signed)
Hansel Starlingdrienne, insurance did pay on the visits in October, 2015 w/bad debt. Once insurance paid, there was a co-pay balance of $20 remaining, this is for both of the bad debt visits.  The same is also true for the 08/31/2014 visit.  However, the DOS 06/10/2014 is a complicated one to figure out. EOB denied due to bundling, but not sure what it should have been "bundled" with.  The visit is for procedure code [id number], which is Transitional Care Manage Service 7 Day Discharge, which is usually a hospital follow up. I can't find any notes in pt's chart nor any appointment on his past appointment desk w/a DOS 06/10/2014, so I'm not sure where the charge is pulling from.  I do have a call into billing to figure that part out.  I spoke w/Chris at ext [phone]. He is to get w/his team lead and will call back.   I have left a message on Mrs. Truszkowski's v/m to return my call.  I will explain the coumadin co-pays and let her know we are working on the other.   Thank you!

## 2015-01-05 NOTE — Telephone Encounter (Signed)
Mrs. Ilene QuaCordova returned my call and I explained the coumadin co-pays to her. She understood and says she's going to call [facility] on 2 others that are on "hold" and then pay all of them at once.  I also told her we were still working on the 06/10/2014 DOS and she says patient was not in for a visit on 06/10/2014.  She states his hospital follow up was actually 05/13/2014. I checked and there are no charges dropped for 05/14/2015, but you can see this DOS on his appointment desk and in his chart.  I'm thinking maybe the 06/10/2014 charges should actually be for the 05/13/2014 DOS. I have called Thayer Ohmhris at billing back and explained this to him. He is passing everything on to his team lead and will c/b once he has an answer.

## 2015-01-06 ENCOUNTER — Other Ambulatory Visit: Payer: Self-pay | Admitting: Urology

## 2015-01-06 DIAGNOSIS — R310 Gross hematuria: Secondary | ICD-10-CM

## 2015-01-12 ENCOUNTER — Ambulatory Visit
Admission: RE | Admit: 2015-01-12 | Discharge: 2015-01-12 | Disposition: A | Discharge status: Home / Self Care | Payer: BLUE CROSS/BLUE SHIELD | Source: Ambulatory Visit | Attending: Urology | Admitting: Urology

## 2015-01-12 DIAGNOSIS — R310 Gross hematuria: Secondary | ICD-10-CM

## 2015-01-12 HISTORY — DX: Essential (primary) hypertension: I10

## 2015-01-12 MED ORDER — IOHEXOL 350 MG/ML SOLN
125.0000 mL | Freq: Once | INTRAVENOUS | Status: AC | PRN
  Administered 2015-01-12: 150 mL via INTRAVENOUS

## 2015-06-01 IMAGING — CT CT ANGIO CHEST
2 of 6 series · 18 of 36 positions shown · IV contrast (omnipaque)
Comparison: CTA chest 05/10/2014.

CLINICAL DATA: History of pulmonary embolus and pulmonary nodule

EXAM:
CT ANGIOGRAPHY CHEST WITH CONTRAST
TECHNIQUE: Multidetector CT imaging of the chest was performed using the
standard protocol during bolus administration of intravenous
contrast. Multiplanar CT image reconstructions and MIPs were
obtained to evaluate the vascular anatomy.
CONTRAST:  85 mL Omnipaque 350

[Series 7: pe thins 1.5 · axial · 0.70mm/px · z∈[-787,-535]mm · 17 of 190 slices shown]
[im 11/190  lung]
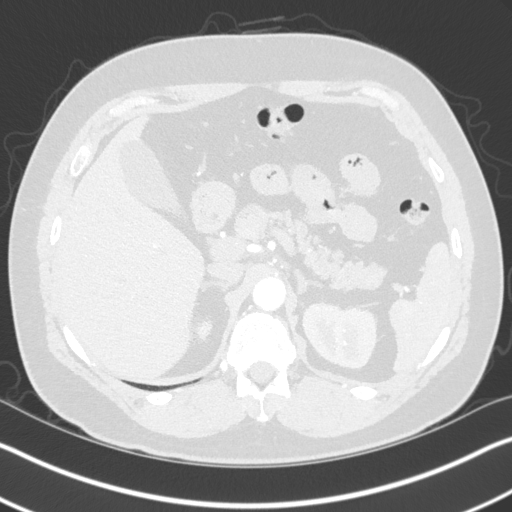
[im 22/190  mediastinal]
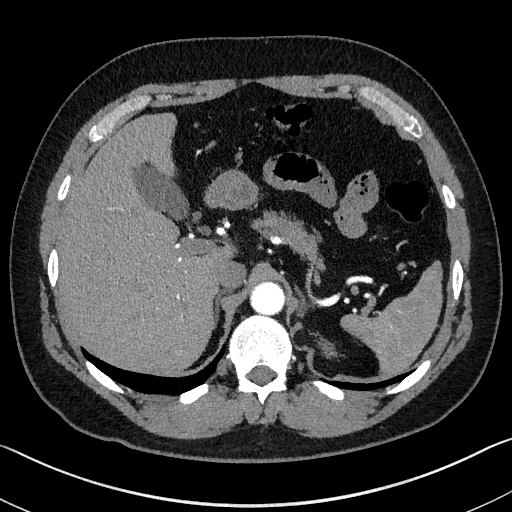
[im 32/190  lung]
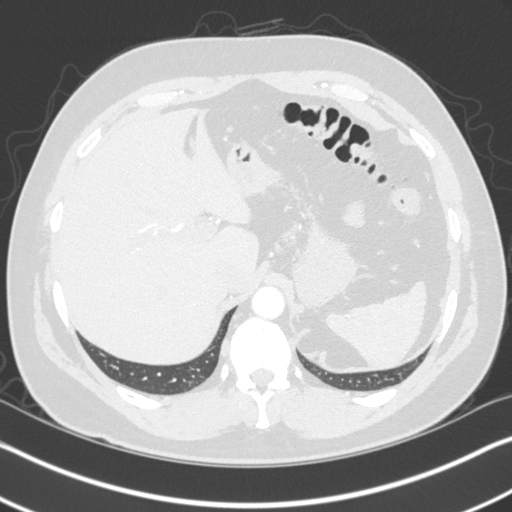
[im 43/190  mediastinal]
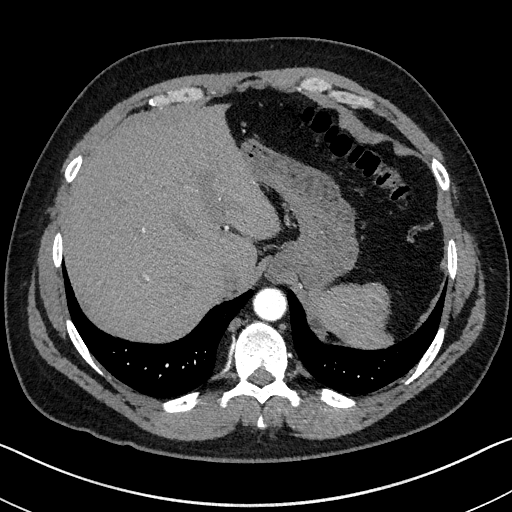
[im 53/190  lung]
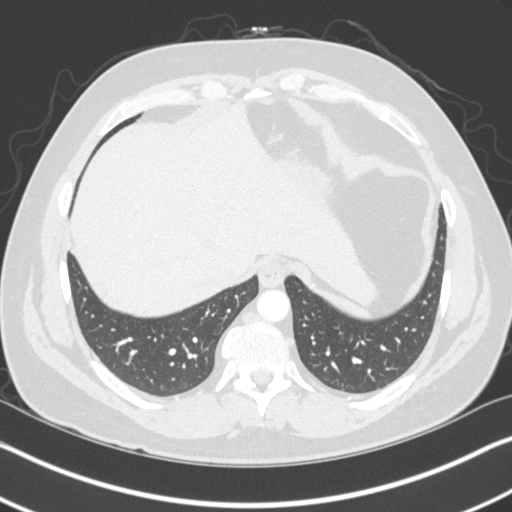
[im 64/190  mediastinal]
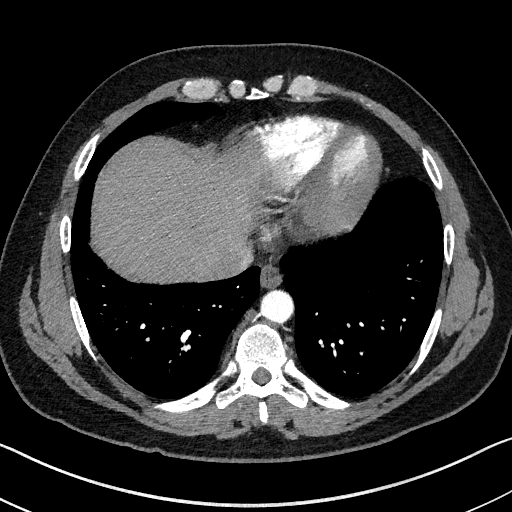
[im 74/190  lung]
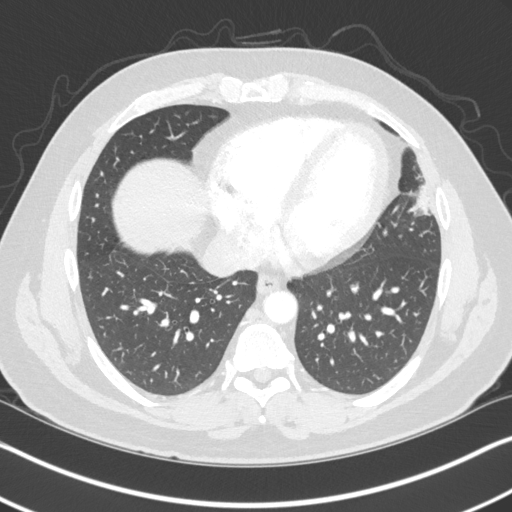
[im 85/190  mediastinal]
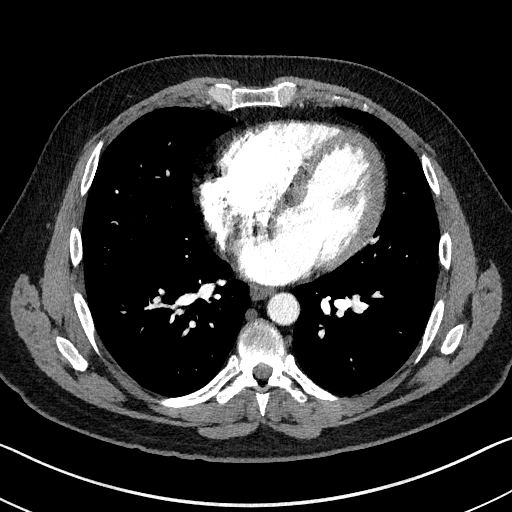
[im 95/190  lung]
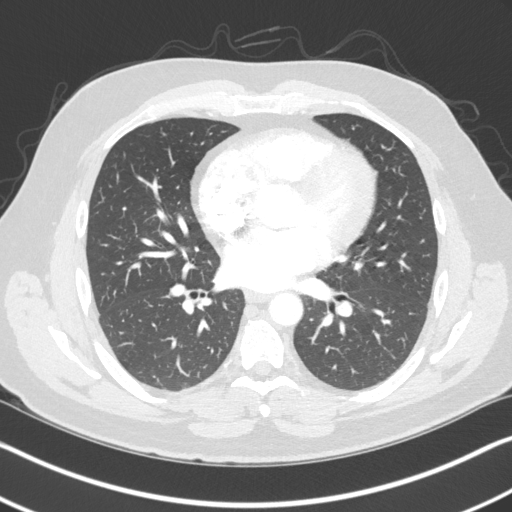
[im 106/190  mediastinal]
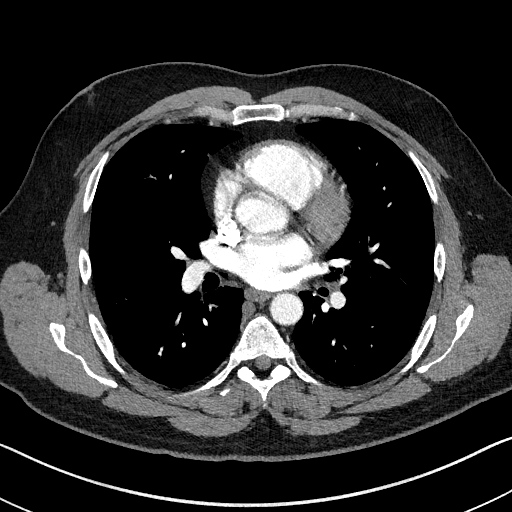
[im 116/190  lung]
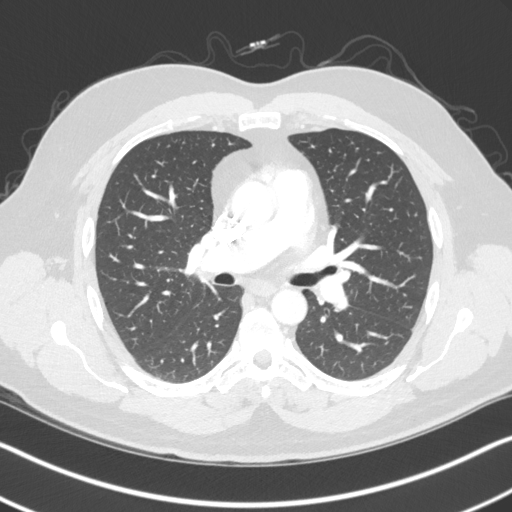
[im 127/190  mediastinal]
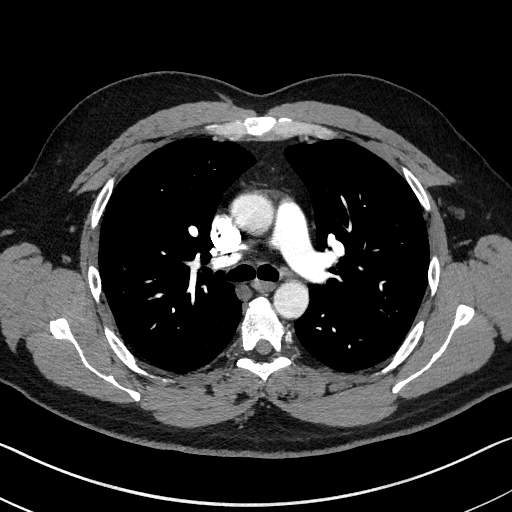
[im 137/190  lung]
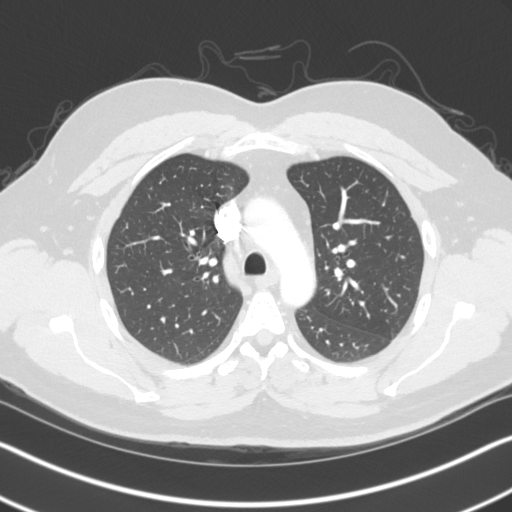
[im 148/190  mediastinal]
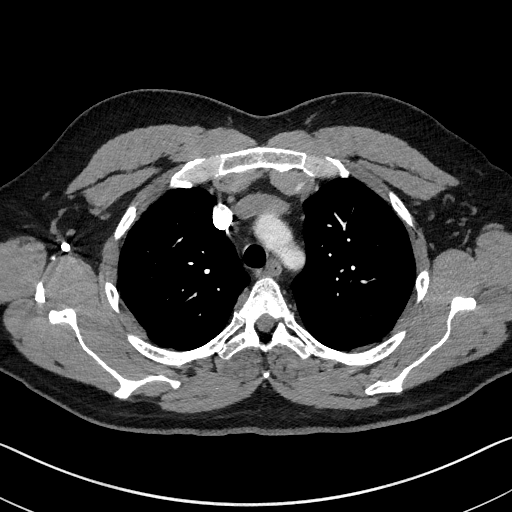
[im 158/190  lung]
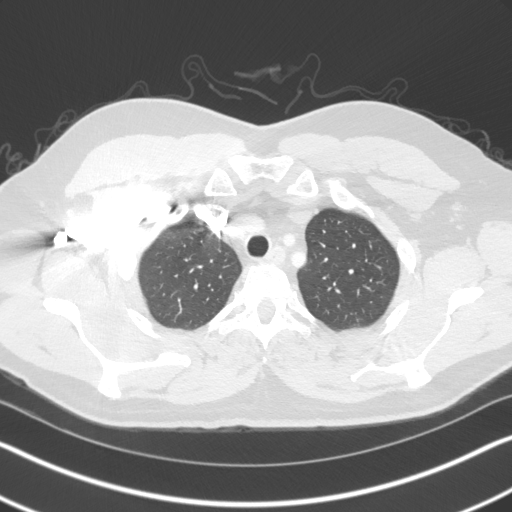
[im 169/190  mediastinal]
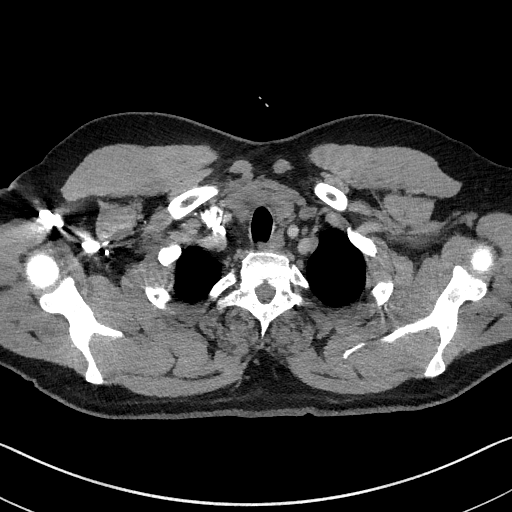
[im 179/190  lung]
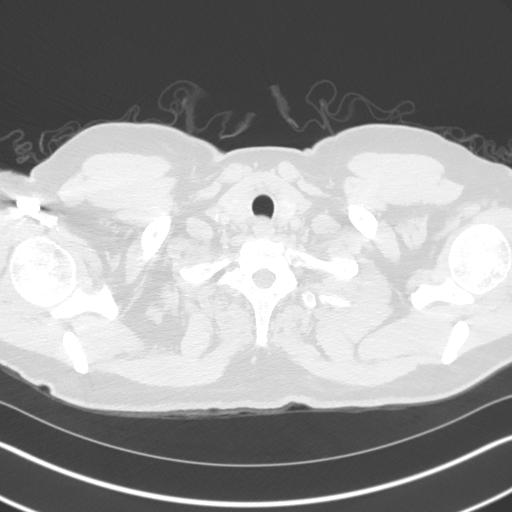

[Series 11: cor pe 2.0 mpr · coronal · 0.57mm/px · 1 of 155 slices shown]
[im 78/155  mediastinal]
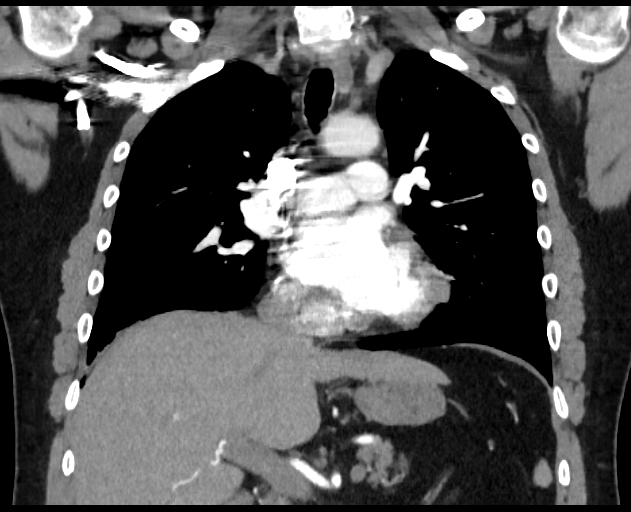

[18 of 36 positions shown; findings below may reference images not displayed]

FINDINGS: Previously-seen pulmonary emboli have resolved. Pulmonary arterial
opacification is satisfactory. There are no focal filling defects on
today's exam. The heart size is normal. A 6 mm nodule on the right
middle lobe is stable. Of note, this nodule is along the minor
fissure and likely represents a subpleural lymph node. No other
focal nodule, mass, or airspace disease is present.

Limited imaging the abdomen is unremarkable.

The bone windows demonstrate mild endplate degenerative change
without focal lytic or blastic lesion. No significant pleural or
pericardial effusion is present.

Previously seen airspace opacification has cleared with the
exception of but minimal scarring at the lingula.

Limited imaging the upper abdomen is unremarkable.

Review of the MIP images confirms the above findings.
IMPRESSION: 1. Resolution of previous pulmonary emboli.
2. Airspace resolution with the exception of minimal residual
disease in the lingula, likely scarring.
3. Stable 6 mm pleural-based nodule in the right middle lobe. If the
patient is at high risk for bronchogenic carcinoma, follow-up chest
CT at 6-12 months is recommended. If the patient is at low risk for
bronchogenic carcinoma, follow-up chest CT at 12 months is
recommended. This recommendation follows the consensus statement:
Guidelines for Management of Small Pulmonary Nodules Detected on CT
Scans: A Statement from the [HOSPITAL] as published in
can be performed at that time as well.

## 2015-09-22 ENCOUNTER — Encounter: Payer: Self-pay | Admitting: Family Medicine

## 2015-09-22 ENCOUNTER — Ambulatory Visit (INDEPENDENT_AMBULATORY_CARE_PROVIDER_SITE_OTHER): Payer: BLUE CROSS/BLUE SHIELD | Admitting: Family Medicine

## 2015-09-22 VITALS — BP 120/84 | HR 65 | Temp 98.3°F | Ht 68.0 in | Wt 228.8 lb

## 2015-09-22 DIAGNOSIS — Z79899 Other long term (current) drug therapy: Secondary | ICD-10-CM | POA: Diagnosis not present

## 2015-09-22 DIAGNOSIS — Z13220 Encounter for screening for lipoid disorders: Secondary | ICD-10-CM

## 2015-09-22 DIAGNOSIS — Z0000 Encounter for general adult medical examination without abnormal findings: Secondary | ICD-10-CM

## 2015-09-22 DIAGNOSIS — Z131 Encounter for screening for diabetes mellitus: Secondary | ICD-10-CM

## 2015-09-22 MED ORDER — LOSARTAN POTASSIUM 50 MG PO TABS: 50.0000 mg | ORAL_TABLET | Freq: Every day | ORAL | Status: DC

## 2015-09-22 NOTE — Progress Notes (Signed)
Dr. Karleen Hampshire T. Dilyn Osoria, MD, CAQ [facility] [address] [place], [address] Phone: [phone] Fax: [phone]  09/22/2015  Patient: Kerry Pierce, MRN: [id number], DOB: 1970-12-26, 45 y.o.  Primary Physician:  Hannah Beat, MD   Chief Complaint  Patient presents with  . Annual Exam   Subjective:   Kerry Pierce is a 45 y.o. pleasant patient who presents with the following:  Preventative Health Maintenance Visit:  Health Maintenance Summary Reviewed and updated, unless pt declines services.  Tobacco History Reviewed. Alcohol: No concerns, no excessive use Exercise Habits: Some activity, rec at least 30 mins 5 times a week STD concerns: no risk or activity to increase risk Drug Use: None Encouraged self-testicular check  HTN? Taking cozaar every morning.   Health Maintenance  Topic Date Due  . HIV Screening  03/16/1986  . INFLUENZA VACCINE  11/19/2015 (Originally 03/22/2015)  . TETANUS/TDAP  06/25/2018   Immunization History  Administered Date(s) Administered  . Influenza,inj,Quad PF,36+ Mos 07/06/2014  . Td 06/25/2008   Patient Active Problem List   Diagnosis Date Noted  . 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 High blood pressure 09/03/2014  . Solitary pulmonary nodule 05/20/2014  . 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 HYPERLIPIDEMIA 06/25/2008  . 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 HLD (hyperlipidemia)   . Asthma     mild; cough variant  . 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 Pain in joint, lower leg     patello-femoral syndrome  . 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 Pulmonary infarction (HCC) 05/13/2014  . Hypertension     Patient states he takes Los[place]in.   Past Surgical History  Procedure Laterality Date  . Wisdom tooth extraction       Social History   Social History  . Marital Status: Married    Spouse Name: N/A  . Number of Children: 3  . Years of Education: N/A   Occupational History  . Principal    Social History Main Topics  . Smoking status: Never Smoker   . Smokeless tobacco: Never Used  . Alcohol Use: No  . Drug Use: No  . Sexual Activity: Not on file   Other Topics Concern  . 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 Arthritis      family history  . Hypertension      family history  . Coronary artery disease      grandparents  . Cancer Maternal Grandmother     lung   Allergies  Allergen Reactions  . Aspirin     REACTION: hives- childhood reaction    Medication list has been reviewed and updated.   General: Denies fever, chills, sweats. No significant weight loss. Eyes: Denies blurring,significant itching ENT: Denies earache, sore throat, and hoarseness. Cardiovascular: Denies chest pains, palpitations, dyspnea on exertion Respiratory: Denies cough, dyspnea at rest,wheeezing Breast: no concerns about lumps GI: Denies nausea, vomiting, diarrhea, constipation, change in bowel habits, abdominal pain, melena, hematochezia GU: Denies penile discharge, ED, urinary flow / outflow problems.  No STD concerns. Musculoskeletal: Denies back pain, joint pain Derm: Denies rash, itching Neuro: Denies  paresthesias, frequent falls, frequent headaches Psych: Denies depression, anxiety Endocrine: Denies cold intolerance, heat intolerance, polydipsia Heme: Denies enlarged lymph nodes Allergy: No hayfever  Objective:   BP 120/84 mmHg  Pulse 65  Temp(Src) 98.3 F (36.8 C) (Oral)  Ht  (1.727 m)  Wt 228 lb 12 oz (103.76 kg)  BMI 34.79 kg/m2 Ideal Body Weight: Weight in (lb) to have BMI = 25:  164.1  No exam data present  GEN: well developed, well nourished, no acute distress Eyes: conjunctiva and lids normal, PERRLA, EOMI ENT: TM clear, nares clear, oral exam WNL Neck: supple, no lymphadenopathy, no thyromegaly, no JVD Pulm: clear to auscultation and percussion, respiratory effort normal CV: regular rate and rhythm, S1-S2, no murmur, rub or gallop, no bruits, peripheral pulses normal and symmetric, no cyanosis, clubbing, edema or varicosities GI: soft, non-tender; no hepatosplenomegaly, masses; active bowel sounds all quadrants GU: no hernia, testicular mass, penile discharge Lymph: no cervical, axillary or inguinal adenopathy MSK: gait normal, muscle tone and strength WNL, no joint swelling, effusions, discoloration, crepitus  SKIN: clear, good turgor, color WNL, no rashes, lesions, or ulcerations Neuro: normal mental status, normal strength, sensation, and motion Psych: alert; oriented to person, place and time, normally interactive and not anxious or depressed in appearance.  Assessment and Plan:   Healthcare maintenance  Screening, lipid - Plan: Lipid panel  Screening for diabetes mellitus - Plan: Basic metabolic panel  Encounter for long-term (current) use of medications - Plan: CBC with Differential/Platelet, Hepatic function panel  Health Maintenance Exam: The patient's preventative maintenance and recommended screening tests for an annual wellness exam were reviewed in full today. Brought up to date unless services declined.  Counselled on the importance of diet, exercise, and its role in overall health and mortality. The patient's FH and SH was reviewed, including their home life, tobacco status, and drug and alcohol status.  Work on [employer] and exercise  Follow-up: No Follow-up on file. Unless noted, follow-up in 1 year for Health Maintenance Exam.  New Prescriptions   No medications on file   Modified Medications   Modified Medication Previous  Medication   LOSARTAN (COZAAR) 50 MG TABLET losartan (COZAAR) 50 MG tablet      Take 1 tablet (50 mg total) by mouth daily.    Take 1 tablet (50 mg total) by mouth daily.   Orders Placed This Encounter  Procedures  . Lipid panel  . Basic metabolic panel  . CBC with Differential/Platelet  . Hepatic function panel    Signed,  Karleen Hampshire T. Maia Handa, MD   Patient's Medications  New Prescriptions   No medications on file  Previous Medications   ALBUTEROL (PROVENTIL HFA;VENTOLIN HFA) 108 (90 BASE) MCG/ACT INHALER    Inhale 2 puffs into the lungs every 6 (six) hours as needed for wheezing or shortness of breath.   MULTIPLE VITAMIN (MULTIVITAMIN) TABLET    Take 1 tablet by mouth daily.     OMEGA-3 FATTY ACIDS (FISH OIL PO)    Take 2 capsules by mouth every evening.   RED YEAST RICE EXTRACT (RED YEAST RICE PO)    Take 2 capsules by mouth 2 (two) times daily.   VITAMIN C (ASCORBIC ACID) 500 MG TABLET    Take 500 mg by mouth daily.    Modified Medications   Modified Medication Previous Medication   LOSARTAN (COZAAR) 50 MG TABLET losartan (COZAAR) 50 MG  tablet      Take 1 tablet (50 mg total) by mouth daily.    Take 1 tablet (50 mg total) by mouth daily.  Discontinued Medications   WARFARIN (COUMADIN) 5 MG TABLET    Take 1 tablet (5 mg total) by mouth at bedtime. 1.5 tablets on Tuesday, Thursday, Saturday, Sunday, and 1 tablet Monday, Wednesday, Friday

## 2015-09-22 NOTE — Progress Notes (Signed)
Pre visit review using our clinic review tool, if applicable. No additional management support is needed unless otherwise documented below in the visit note. 

## 2015-09-23 LAB — LIPID PANEL
CHOL/HDL RATIO: 5
Cholesterol: 224 mg/dL — ABNORMAL HIGH (ref 0–200)
HDL: 46.1 mg/dL (ref >39.00)
LDL Cholesterol: 153 mg/dL — ABNORMAL HIGH (ref 0–99)
NONHDL: 178.21
Triglycerides: 124 mg/dL (ref 0.0–149.0)
VLDL: 24.8 mg/dL (ref 0.0–40.0)

## 2015-09-23 LAB — BASIC METABOLIC PANEL
BUN: 13 mg/dL (ref 6–23)
CHLORIDE: 100 meq/L (ref 96–112)
CO2: 30 mEq/L (ref 19–32)
Calcium: 10.1 mg/dL (ref 8.4–10.5)
Creatinine, Ser: 1.05 mg/dL (ref 0.40–1.50)
GFR: 81.36 mL/min (ref >60.00)
Glucose, Bld: 91 mg/dL (ref 70–99)
POTASSIUM: 4.4 meq/L (ref 3.5–5.1)
Sodium: 138 mEq/L (ref 135–145)

## 2015-09-23 LAB — CBC WITH DIFFERENTIAL/PLATELET
BASOS PCT: 1.2 % (ref 0.0–3.0)
Basophils Absolute: 0.1 10*3/uL (ref 0.0–0.1)
EOS PCT: 1.2 % (ref 0.0–5.0)
Eosinophils Absolute: 0.1 10*3/uL (ref 0.0–0.7)
HEMATOCRIT: 45 % (ref 39.0–52.0)
HEMOGLOBIN: 15 g/dL (ref 13.0–17.0)
LYMPHS PCT: 30.9 % (ref 12.0–46.0)
Lymphs Abs: 3.4 10*3/uL (ref 0.7–4.0)
MCHC: 33.3 g/dL (ref 30.0–36.0)
MCV: 86.2 fl (ref 78.0–100.0)
Monocytes Absolute: 1 10*3/uL (ref 0.1–1.0)
Monocytes Relative: 8.8 % (ref 3.0–12.0)
Neutro Abs: 6.3 10*3/uL (ref 1.4–7.7)
Neutrophils Relative %: 57.9 % (ref 43.0–77.0)
Platelets: 315 10*3/uL (ref 150.0–400.0)
RBC: 5.22 Mil/uL (ref 4.22–5.81)
RDW: 13.3 % (ref 11.5–15.5)
WBC: 11 10*3/uL — AB (ref 4.0–10.5)

## 2015-09-23 LAB — HEPATIC FUNCTION PANEL
ALT: 26 U/L (ref 0–53)
AST: 20 U/L (ref 0–37)
Albumin: 4.6 g/dL (ref 3.5–5.2)
Alkaline Phosphatase: 72 U/L (ref 39–117)
BILIRUBIN DIRECT: 0.1 mg/dL (ref 0.0–0.3)
Total Bilirubin: 0.6 mg/dL (ref 0.2–1.2)
Total Protein: 8 g/dL (ref 6.0–8.3)

## 2016-05-16 ENCOUNTER — Telehealth: Payer: Self-pay | Admitting: Internal Medicine

## 2016-05-16 NOTE — Telephone Encounter (Signed)
Pt returned my call and stated that his insurance was the same.  Advised patient that I would submit this request to [facility], however, they may want a current face to face with physician of which the patient was fine with.  Advised patient that I would [facility] today, to allow 10-14 days for BCBS to respond.  If [employer] requested a current OV note, then I will contact him back to schedule an appointment to come in and review his symptoms with Dr. Dema SeverinMungal. Pt is agreeable to this and thank me for my time. [employer]BS prior authorization form placed in Dr. Courtney ParisMungal's folder to complete. Once completed I will fax to [facility]. Pt is aware of this process and also aware that [employer]BS may require patient to have in lab study as well. Pt is willing to do whichever one the insurance will pre cert. Advised patient that once we hear back from [facility] that I will contact him back with what we need to do. Pt voiced understanding. Nothing else needed at this time. Rhonda J Cobb

## 2016-05-16 NOTE — Telephone Encounter (Signed)
Per VM last OV note on 11/11/14: Events since last clinic visit: Presents today for a follow up visit of his bilateral PE, had CT Chest with contrast done on 11/06/14. Patient states overall he is doing well, he has been followed by hematology oncology, see note below. No complaints of bleeding episodes, further pulmonary embolisms, a DVT type symptoms. At his last visit a sleep study was recommended, however patient stated that he wanted to try to lose some more weight before he decided and the sleep study. Since his last visit he has been diagnosed with high blood pressure and is currently being treated for that. Review of hematoma notes suggest that he may have a mild genetic component to his PE hence the use of warfarin for treating his recent pulmonary embolus.  [place].

## 2016-05-16 NOTE — Telephone Encounter (Signed)
ATC patient and no answer. LMOAM for pt to return my call.  Need to check if patient has the same insurance. Waiting on return call from patient. Rhonda J Cobb

## 2016-05-16 NOTE — Telephone Encounter (Signed)
Pt states he is ready to schedule his sleep study. Please call.

## 2016-05-22 ENCOUNTER — Ambulatory Visit: Payer: BLUE CROSS/BLUE SHIELD | Admitting: Internal Medicine

## 2016-06-12 ENCOUNTER — Ambulatory Visit: Payer: BLUE CROSS/BLUE SHIELD | Admitting: Internal Medicine

## 2016-06-13 ENCOUNTER — Encounter: Payer: Self-pay | Admitting: Internal Medicine

## 2016-06-13 ENCOUNTER — Ambulatory Visit (INDEPENDENT_AMBULATORY_CARE_PROVIDER_SITE_OTHER): Payer: BLUE CROSS/BLUE SHIELD | Admitting: Internal Medicine

## 2016-06-13 VITALS — BP 134/80 | HR 71 | Ht 69.0 in | Wt 234.0 lb

## 2016-06-13 DIAGNOSIS — G479 Sleep disorder, unspecified: Secondary | ICD-10-CM | POA: Diagnosis not present

## 2016-06-13 DIAGNOSIS — R911 Solitary pulmonary nodule: Secondary | ICD-10-CM

## 2016-06-13 NOTE — Patient Instructions (Addendum)
Follow up with Dr. Dema SeverinMungal in:4 weeks - sleep study prior to follow - CT Chest w/o contrast - RML nodule prior to follow up - diet and exercise  Sleep Apnea  Sleep apnea is a sleep disorder characterized by abnormal pauses in breathing while you sleep. When your breathing pauses, the level of oxygen in your blood decreases. This causes you to move out of deep sleep and into light sleep. As a result, your quality of sleep is poor, and the system that carries your blood throughout your body (cardiovascular system) experiences stress. If sleep apnea remains untreated, the following conditions can develop:  High blood pressure (hypertension).  Coronary artery disease.  Inability to achieve or maintain an erection (impotence).  Impairment of your thought process (cognitive dysfunction). There are three types of sleep apnea: 1. Obstructive sleep apnea--Pauses in breathing during sleep because of a blocked airway. 2. Central sleep apnea--Pauses in breathing during sleep because the area of the brain that controls your breathing does not send the correct signals to the muscles that control breathing. 3. Mixed sleep apnea--A combination of both obstructive and central sleep apnea. RISK FACTORS The following risk factors can increase your risk of developing sleep apnea:  Being overweight.  Smoking.  Having narrow passages in your nose and throat.  Being of older age.  Being male.  Alcohol use.  Sedative and tranquilizer use.  Ethnicity. Among individuals younger than 35 years, African Americans are at increased risk of sleep apnea. SYMPTOMS   Difficulty staying asleep.  Daytime sleepiness and fatigue.  Loss of energy.  Irritability.  Loud, heavy snoring.  Morning headaches.  Trouble concentrating.  Forgetfulness.  Decreased interest in sex.  Unexplained sleepiness. DIAGNOSIS  In order to diagnose sleep apnea, your caregiver will perform a physical examination. A sleep  study done in the comfort of your own home may be appropriate if you are otherwise healthy. Your caregiver may also recommend that you spend the night in a sleep lab. In the sleep lab, several monitors record information about your heart, lungs, and brain while you sleep. Your leg and arm movements and blood oxygen level are also recorded. TREATMENT The following actions may help to resolve mild sleep apnea:  Sleeping on your side.   Using a decongestant if you have nasal congestion.   Avoiding the use of depressants, including alcohol, sedatives, and narcotics.   Losing weight and modifying your diet if you are overweight. There also are devices and treatments to help open your airway:  Oral appliances. These are custom-made mouthpieces that shift your lower jaw forward and slightly open your bite. This opens your airway.  Devices that create positive airway pressure. This positive pressure "splints" your airway open to help you breathe better during sleep. The following devices create positive airway pressure:  Continuous positive airway pressure (CPAP) device. The CPAP device creates a continuous level of air pressure with an air pump. The air is delivered to your airway through a mask while you sleep. This continuous pressure keeps your airway open.  Nasal expiratory positive airway pressure (EPAP) device. The EPAP device creates positive air pressure as you exhale. The device consists of single-use valves, which are inserted into each nostril and held in place by adhesive. The valves create very little resistance when you inhale but create much more resistance when you exhale. That increased resistance creates the positive airway pressure. This positive pressure while you exhale keeps your airway open, making it easier to breath when you  inhale again.  Bilevel positive airway pressure (BPAP) device. The BPAP device is used mainly in patients with central sleep apnea. This device is  similar to the CPAP device because it also uses an air pump to deliver continuous air pressure through a mask. However, with the BPAP machine, the pressure is set at two different levels. The pressure when you exhale is lower than the pressure when you inhale.  Surgery. Typically, surgery is only done if you cannot comply with less invasive treatments or if the less invasive treatments do not improve your condition. Surgery involves removing excess tissue in your airway to create a wider passage way.   This information is not intended to replace advice given to you by your health care provider. Make sure you discuss any questions you have with your health care provider.   Document Released: 07/28/2002 Document Revised: 08/28/2014 Document Reviewed: 12/14/2011 Elsevier Interactive Patient Education [employer]

## 2016-06-13 NOTE — Progress Notes (Signed)
[facility]      MRN# [id number] Kerry LimaJeremy Pierce 1971/01/19   CC: Chief Complaint  Patient presents with  . Follow-up    to discuss sleep options; wakes often thru night; snores; tired during day at times      Brief History: 45 yo history of pulmonary embolism (immobility, long car ride), treated by hematology oncology with warfarin x 6 months in 2016.  Hx of 6mm RML pulmonary nodule.    Events since last clinic visit: Patient visit today for follow-up visit. He is not been seen in over 1 year, he has not had one-year follow-up of his right middle lobe 6 mm pulmonary nodule, at that time felt to be a subpleural subcentimeter nodule along the fissure. Also at his last visit sleep study in the form of inpatient lab or home study was discussed with the patient, however, neither of these been performed.    Obstructive Sleep Apnea Screening The patient was screened with the STOP-BANG questionnaire. >3 positive responses is considered a positive screen  Snoring  YES Tiredness  YES Observed Apnea YES Pressure (HTN) YES BMI >35  NO Age > 50  NO Neck >17"  YES Gender (male)  YES  Total: 6/8   Screen: POSITIVE        Current Outpatient Prescriptions:  .  albuterol (PROVENTIL HFA;VENTOLIN HFA) 108 (90 BASE) MCG/ACT inhaler, Inhale 2 puffs into the lungs every 6 (six) hours as needed for wheezing or shortness of breath., Disp: 1 Inhaler, Rfl: 2 .  losartan (COZAAR) 50 MG tablet, Take 1 tablet (50 mg total) by mouth daily., Disp: 90 tablet, Rfl: 3 .  Multiple Vitamin (MULTIVITAMIN) tablet, Take 1 tablet by mouth daily.  , Disp: , Rfl:  .  Omega-3 Fatty Acids (FISH OIL PO), Take 2 capsules by mouth every evening., Disp: , Rfl:  .  Red Yeast Rice Extract (RED YEAST RICE PO), Take 2 capsules by mouth 2 (two) times daily., Disp: , Rfl:  .  vitamin C (ASCORBIC ACID) 500 MG tablet, Take 500 mg by mouth daily.  , Disp: , Rfl:    Review of Systems    Constitutional: Positive for malaise/fatigue. Negative for chills, fever and weight loss.  Eyes: Negative for blurred vision and double vision.  Respiratory: Negative for cough.   Cardiovascular: Negative for chest pain.  Gastrointestinal: Negative for heartburn and nausea.  Genitourinary: Negative for dysuria.  Musculoskeletal: Negative for myalgias.  Skin: Negative for itching and rash.  Neurological: Negative for dizziness, tingling and headaches.  Endo/Heme/Allergies: Negative for environmental allergies. Does not bruise/bleed easily.  Psychiatric/Behavioral: Negative for depression.      Allergies:  Aspirin  Physical Examination:  VS: BP 134/80 (BP Location: Left Arm, Cuff Size: Normal)   Pulse 71   Ht 5\' 9"  (1.753 m)   Wt 234 lb (106.1 kg)   SpO2 96%   BMI 34.56 kg/m   General Appearance: No distress  HEENT: PERRLA, no ptosis, no other lesions noticed Pulmonary:normal breath sounds., diaphragmatic excursion normal.No wheezing, No rales   Cardiovascular:  Normal S1,S2.  No m/r/g.     Abdomen:Exam: Benign, Soft, non-tender, No masses  Skin:   warm, no rashes, no ecchymosis  Extremities: normal, no cyanosis, clubbing, warm with normal capillary refill.      Rad results: (The following images and results were reviewed by Dr. Dema SeverinMungal).     Assessment and Plan: Sleep disturbance Patient with increase AM fatigue and daytime sleepiness.  Didn't get  sleep study done last year.  Epworth = 10  Obstructive Sleep Apnea Screening The patient was screened with the STOP-BANG questionnaire. >3 positive responses is considered a positive screen  Snoring                        YES Tiredness                    YES Observed Apnea         YES Pressure (HTN)           NO BMI >35                      NO Age > 50                     NO Neck >17"                   YES Gender (male)             YES  Total: 6/8                    Screen: POSITIVE   Plan - full NPSG -  follow up after sleep study - diet, exercise and wt. Loss   Solitary pulmonary nodule Most likely sequela of his pulmonary embolism. He is noted to have a embolism in the area of the right middle lobe where the nodule is present. Low risk for bronchogenic carcinoma, followup CAT scan with no increase in size or characteristics. Last CT chest 11/2014  Plan: - CT chest w/o contrast, prior to follow up visit.     Updated Medication List Outpatient Encounter Prescriptions as of 06/13/2016  Medication Sig  . albuterol (PROVENTIL HFA;VENTOLIN HFA) 108 (90 BASE) MCG/ACT inhaler Inhale 2 puffs into the lungs every 6 (six) hours as needed for wheezing or shortness of breath.  . losartan (COZAAR) 50 MG tablet Take 1 tablet (50 mg total) by mouth daily.  . Multiple Vitamin (MULTIVITAMIN) tablet Take 1 tablet by mouth daily.    . Omega-3 Fatty Acids (FISH OIL PO) Take 2 capsules by mouth every evening.  . Red Yeast Rice Extract (RED YEAST RICE PO) Take 2 capsules by mouth 2 (two) times daily.  . vitamin C (ASCORBIC ACID) 500 MG tablet Take 500 mg by mouth daily.     No facility-administered encounter medications on file as of 06/13/2016.     Orders for this visit: Orders Placed This Encounter  Procedures  . CT CHEST WO CONTRAST    Standing Status:   Future    Standing Expiration Date:   08/13/2017    Order Specific Question:   Reason for Exam (SYMPTOM  OR DIAGNOSIS REQUIRED)    Answer:   RML nodule    Order Specific Question:   Preferred imaging location?    Answer:    [facility]  . Home sleep test    Standing Status:   Future    Standing Expiration Date:   06/13/2017    Order Specific Question:   Where should this test be performed:    Answer:   Other    Thank  you for the visitation and for allowing  [facility] to assist in the care of your patient. Our recommendations are noted above.  Please contact us if we can be of further service.  Stephanie Acre,  MD [facility] Office Number: [phone]  Note: This note was prepared with Dragon dictation along with smaller phrase technology. Any transcriptional errors that result from this process are unintentional.

## 2016-06-13 NOTE — Assessment & Plan Note (Signed)
Most likely sequela of his pulmonary embolism. He is noted to have a embolism in the area of the right middle lobe where the nodule is present. Low risk for bronchogenic carcinoma, followup CAT scan with no increase in size or characteristics. Last CT chest 11/2014  Plan: - CT chest w/o contrast, prior to follow up visit.

## 2016-06-13 NOTE — Assessment & Plan Note (Signed)
Patient with increase AM fatigue and daytime sleepiness.  Didn't get sleep study done last year.  Epworth = 10  Obstructive Sleep Apnea Screening The patient was screened with the STOP-BANG questionnaire. >3 positive responses is considered a positive screen  Snoring                        YES Tiredness                    YES Observed Apnea         YES Pressure (HTN)           NO BMI >35                      NO Age > 50                     NO Neck >17"                   YES Gender (male)             YES  Total: 6/8                    Screen: POSITIVE   Plan - full NPSG - follow up after sleep study - diet, exercise and wt. Loss

## 2016-06-19 ENCOUNTER — Telehealth: Payer: Self-pay | Admitting: Internal Medicine

## 2016-06-19 DIAGNOSIS — G4719 Other hypersomnia: Secondary | ICD-10-CM

## 2016-06-19 NOTE — Telephone Encounter (Signed)
Per [employer] request for HST has been denied by "[employer]BS due to patient not meeting the definition of Medical Necessity."    Need order for an in lab study. Rhonda J Cobb

## 2016-06-19 NOTE — Telephone Encounter (Signed)
Split night order due to insurance denying HST.

## 2016-06-21 ENCOUNTER — Ambulatory Visit: Payer: BLUE CROSS/BLUE SHIELD | Admitting: Internal Medicine

## 2016-06-22 ENCOUNTER — Encounter: Payer: Self-pay | Admitting: Family Medicine

## 2016-06-22 ENCOUNTER — Ambulatory Visit (INDEPENDENT_AMBULATORY_CARE_PROVIDER_SITE_OTHER): Payer: BLUE CROSS/BLUE SHIELD | Admitting: Family Medicine

## 2016-06-22 VITALS — BP 134/62 | HR 82 | Temp 98.1°F | Wt 229.5 lb

## 2016-06-22 DIAGNOSIS — L0292 Furuncle, unspecified: Secondary | ICD-10-CM | POA: Diagnosis not present

## 2016-06-22 DIAGNOSIS — N492 Inflammatory disorders of scrotum: Secondary | ICD-10-CM

## 2016-06-22 MED ORDER — SULFAMETHOXAZOLE-TRIMETHOPRIM 800-160 MG PO TABS: 2.0000 | ORAL_TABLET | Freq: Two times a day (BID) | ORAL | 0 refills | Status: DC

## 2016-06-22 NOTE — Progress Notes (Signed)
   Subjective:    Patient ID: Kerry Pierce, male    DOB: 03-Dec-1970, 45 y.o.   MRN: [id number]  HPI This is a 45 yo male who presents today with a boil on his scrotum. Noticed it 2 days ago and it has gotten larger and more painful. No fever. No swollen lymph nodes. No urinary difficulty, no penile drainage.  No abdominal pain, no nausea, no vomiting. Married in monogamous relationship. Had a boil on his buttock as a child, but has otherwise not had any similar symptoms.   Past Medical History:  Diagnosis Date  . Asthma    mild; cough variant  . 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 HLD (hyperlipidemia)   . 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 Hypertension    Patient states he takes Losartin.  . Pain in joint, lower leg    patello-femoral syndrome  . 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 WISDOM TOOTH EXTRACTION     Family History  Problem Relation Age of Onset  . Arthritis      family history  . Hypertension      family history  . Coronary artery disease      grandparents  . Cancer Maternal Grandmother     lung   Social History  Substance Use Topics  . Smoking status: Never Smoker  . Smokeless tobacco: Never Used  . Alcohol use No      Review of Systems Per HPI    Objective:   Physical Exam  Constitutional: He is oriented to person, place, and time. He appears well-developed and well-nourished. No distress.  HENT:  Head: Normocephalic and atraumatic.  Cardiovascular: Normal rate.   Pulmonary/Chest: Effort normal.  Genitourinary: Penis normal. Circumcised. No penile erythema. No discharge found.  Genitourinary Comments: 1 cm area of abscess with 1 cm firm nodule. Area of purulence visualized, no drainage. Area cleaned with alcohol and 23 gauge needle used to pierce are to obtain wound culture. Small amount thick purulent drainage obtained. Area  firm, non fluctuant therefore not opened further.   Neurological: He is alert and oriented to person, place, and time.  Skin: Skin is warm and dry. He is not diaphoretic.  Psychiatric: He has a normal mood and affect. His behavior is normal. Judgment and thought content normal.  Vitals reviewed.     BP 134/62   Pulse 82   Temp 98.1 F (36.7 C) (Oral)   Wt 229 lb 8 oz (104.1 kg)   SpO2 98%   BMI 33.89 kg/m  Wt Readings from Last 3 Encounters:  06/22/16 229 lb 8 oz (104.1 kg)  06/13/16 234 lb (106.1 kg)  09/22/15 228 lb 12 oz (103.8 kg)       Assessment & Plan:  1. Boil - WOUND CULTURE  2. Scrotal wall abscess - Provided written and verbal information regarding diagnosis and treatment. - RTC/ER precautions reviewed - if not improved in 3-4 days, RTC, if area of edema/erythema doesn't resolve completely, will need ultrasound- discussed this with patient - sulfamethoxazole-trimethoprim (BACTRIM DS,SEPTRA DS) 800-160 MG tablet; Take 2 tablets by mouth 2 (two) times daily.  Dispense: 28 tablet; Refill: 0   Olean Reeeborah Brileigh Sevcik, FNP-BC  [facility], [facility]  06/22/2016 1:50 PM

## 2016-06-22 NOTE — Progress Notes (Signed)
Pre visit review using our clinic review tool, if applicable. No additional management support is needed unless otherwise documented below in the visit note. 

## 2016-06-22 NOTE — Patient Instructions (Signed)
If you have worsening symptoms, please let us know or go to ER if at night or weekend Can take Alleve 2 tablets every 12 hours with Tylenol in between for pain Please take a warm bath and/or apply warm compresses several times a day until improved If area of pain and swelling is not significantly improved in 5-7 days, please come in for follow up If the area of swelling does not resolve completely, please come in for follow up, you may need an ultrasound  Abscess An abscess is an infected area that contains a collection of pus and debris.It can occur in almost any part of the body. An abscess is also known as a furuncle or boil. CAUSES  An abscess occurs when tissue gets infected. This can occur from blockage of oil or sweat glands, infection of hair follicles, or a minor injury to the skin. As the body tries to fight the infection, pus collects in the area and creates pressure under the skin. This pressure causes pain. People with weakened immune systems have difficulty fighting infections and get certain abscesses more often.  SYMPTOMS Usually an abscess develops on the skin and becomes a painful mass that is red, warm, and tender. If the abscess forms under the skin, you may feel a moveable soft area under the skin. Some abscesses break open (rupture) on their own, but most will continue to get worse without care. The infection can spread deeper into the body and eventually into the bloodstream, causing you to feel ill.  DIAGNOSIS  Your caregiver will take your medical history and perform a physical exam. A sample of fluid may also be taken from the abscess to determine what is causing your infection. TREATMENT  Your caregiver may prescribe antibiotic medicines to fight the infection. However, taking antibiotics alone usually does not cure an abscess. Your caregiver may need to make a small cut (incision) in the abscess to drain the pus. In some cases, gauze is packed into the abscess to reduce  pain and to continue draining the area. HOME CARE INSTRUCTIONS   Only take over-the-counter or prescription medicines for pain, discomfort, or fever as directed by your caregiver.  If you were prescribed antibiotics, take them as directed. Finish them even if you start to feel better.  If gauze is used, follow your caregiver's directions for changing the gauze.  To avoid spreading the infection:  Keep your draining abscess covered with a bandage.  Wash your hands well.  Do not share personal care items, towels, or whirlpools with others.  Avoid skin contact with others.  Keep your skin and clothes clean around the abscess.  Keep all follow-up appointments as directed by your caregiver. SEEK MEDICAL CARE IF:   You have increased pain, swelling, redness, fluid drainage, or bleeding.  You have muscle aches, chills, or a general ill feeling.  You have a fever. MAKE SURE YOU:   Understand these instructions.  Will watch your condition.  Will get help right away if you are not doing well or get worse.   This information is not intended to replace advice given to you by your health care provider. Make sure you discuss any questions you have with your health care provider.   Document Released: 05/17/2005 Document Revised: 02/06/2012 Document Reviewed: 10/20/2011 Elsevier Interactive Patient Education [employer]2016 Elsevier Inc.

## 2016-06-25 LAB — WOUND CULTURE
GRAM STAIN: NONE SEEN
GRAM STAIN: NONE SEEN

## 2016-07-06 ENCOUNTER — Ambulatory Visit
Admission: RE | Admit: 2016-07-06 | Discharge: 2016-07-06 | Disposition: A | Discharge status: Home / Self Care | Payer: BLUE CROSS/BLUE SHIELD | Source: Ambulatory Visit | Attending: Internal Medicine | Admitting: Internal Medicine

## 2016-07-06 DIAGNOSIS — J984 Other disorders of lung: Secondary | ICD-10-CM | POA: Insufficient documentation

## 2016-07-06 DIAGNOSIS — R911 Solitary pulmonary nodule: Secondary | ICD-10-CM | POA: Insufficient documentation

## 2016-07-10 ENCOUNTER — Ambulatory Visit (INDEPENDENT_AMBULATORY_CARE_PROVIDER_SITE_OTHER): Payer: BLUE CROSS/BLUE SHIELD | Admitting: Internal Medicine

## 2016-07-10 ENCOUNTER — Encounter: Payer: Self-pay | Admitting: Internal Medicine

## 2016-07-10 VITALS — BP 118/80 | HR 79 | Ht 69.0 in | Wt 234.0 lb

## 2016-07-10 DIAGNOSIS — G479 Sleep disorder, unspecified: Secondary | ICD-10-CM | POA: Diagnosis not present

## 2016-07-10 DIAGNOSIS — R911 Solitary pulmonary nodule: Secondary | ICD-10-CM

## 2016-07-10 NOTE — Assessment & Plan Note (Signed)
Most likely sequela of his pulmonary embolism. He is noted to have a embolism in the area of the right middle lobe where the nodule is present. Low risk for bronchogenic carcinoma, followup CAT scan with no increase in size or characteristics.  Plan: - stable nodule over 2 years, no further workup needed at this time.

## 2016-07-10 NOTE — Assessment & Plan Note (Signed)
Patient with increase AM fatigue and daytime sleepiness.  Didn't get sleep study done last year.  Epworth = 10  Obstructive Sleep Apnea Screening The patient was screened with the STOP-BANG questionnaire. >3 positive responses is considered a positive screen  Snoring                        YES Tiredness                    YES Observed Apnea         YES Pressure (HTN)           NO BMI >35                      NO Age > 50                     NO Neck >17"                   YES Gender (male)             YES  Total: 6/8                    Screen: POSITIVE   Plan - full NPSG - scheduled for 07/28/16 - follow up with Dr. Ardyth Manam after sleep study - diet, exercise and wt. Loss

## 2016-07-10 NOTE — Patient Instructions (Addendum)
Follow with Dr. Ardyth Manam in 8 wks - keep sleep study appointment in Dec 2017 - cont with diet and exercise.  - stable 6mm RML lung nodule over the last 2 years, no further Ct Chest scans needed.

## 2016-07-10 NOTE — Progress Notes (Signed)
[facility]      MRN# [id number] Kerry LimaJeremy Pierce [birth year]   CC: Chief Complaint  Patient presents with  . Follow-up    CT results: dry cough in mornings in cold: involuntary nreath in 3-4 week      Brief History: 45 yo history of pulmonary embolism (immobility, long car ride), treated by hematology oncology with warfarin x 6 months in 2016.  Hx of 6mm RML pulmonary nodule.    Events since last clinic visit: Patient visit today for follow-up visit. At last visit noted to have positive STOP-BANG screening, currently scheduled for NPSG in Dec. Had repeat Ct chest for solitary pulm nodule after a PE 2 years ago. Results discussed, stable nodule.    Obstructive Sleep Apnea Screening The patient was screened with the STOP-BANG questionnaire. >3 positive responses is considered a positive screen  Snoring  YES Tiredness  YES Observed Apnea YES Pressure (HTN) YES BMI >35  NO Age > 50  NO Neck >17"  YES Gender (male)  YES  Total: 6/8   Screen: POSITIVE        Current Outpatient Prescriptions:  .  albuterol (PROVENTIL HFA;VENTOLIN HFA) 108 (90 BASE) MCG/ACT inhaler, Inhale 2 puffs into the lungs every 6 (six) hours as needed for wheezing or shortness of breath., Disp: 1 Inhaler, Rfl: 2 .  losartan (COZAAR) 50 MG tablet, Take 1 tablet (50 mg total) by mouth daily., Disp: 90 tablet, Rfl: 3 .  Multiple Vitamin (MULTIVITAMIN) tablet, Take 1 tablet by mouth daily.  , Disp: , Rfl:  .  Omega-3 Fatty Acids (FISH OIL PO), Take 2 capsules by mouth every evening., Disp: , Rfl:  .  Red Yeast Rice Extract (RED YEAST RICE PO), Take 2 capsules by mouth 2 (two) times daily., Disp: , Rfl:  .  vitamin C (ASCORBIC ACID) 500 MG tablet, Take 500 mg by mouth daily.  , Disp: , Rfl:    Review of Systems  Constitutional: Positive for malaise/fatigue. Negative for chills, fever and weight loss.  Eyes: Negative for blurred vision and double vision.  Respiratory:  Negative for cough.   Cardiovascular: Negative for chest pain.  Gastrointestinal: Negative for heartburn and nausea.  Genitourinary: Negative for dysuria.  Musculoskeletal: Negative for myalgias.  Skin: Negative for itching and rash.  Neurological: Negative for dizziness, tingling and headaches.  Endo/Heme/Allergies: Negative for environmental allergies. Does not bruise/bleed easily.  Psychiatric/Behavioral: Negative for depression.      Allergies:  Aspirin  Physical Examination:  VS: BP 118/80 (BP Location: Left Arm, Cuff Size: Normal)   Pulse 79   Ht 5\' 9"  (1.753 m)   Wt 234 lb (106.1 kg)   SpO2 99%   BMI 34.56 kg/m   General Appearance: No distress  HEENT: PERRLA, no ptosis, no other lesions noticed Pulmonary:normal breath sounds., diaphragmatic excursion normal.No wheezing, No rales   Cardiovascular:  Normal S1,S2.  No m/r/g.     Abdomen:Exam: Benign, Soft, non-tender, No masses  Skin:   warm, no rashes, no ecchymosis  Extremities: normal, no cyanosis, clubbing, warm with normal capillary refill.      Rad results: (The following images and results were reviewed by Dr. Dema SeverinMungal on 07/10/2016). CT Chest 06/2016 IMPRESSION: 1. Subpleural 6 mm right middle lobe pulmonary nodule is stable back to 05/10/2014 and considered benign. 2. Stable postinfectious/postinflammatory scar in the lingula . 3. No active disease in the chest.      Assessment and Plan: Solitary pulmonary nodule Most likely sequela of  his pulmonary embolism. He is noted to have a embolism in the area of the right middle lobe where the nodule is present. Low risk for bronchogenic carcinoma, followup CAT scan with no increase in size or characteristics.  Plan: - stable nodule over 2 years, no further workup needed at this time.    Sleep disturbance Patient with increase AM fatigue and daytime sleepiness.  Didn't get sleep study done last year.  Epworth = 10  Obstructive Sleep Apnea Screening The  patient was screened with the STOP-BANG questionnaire. >3 positive responses is considered a positive screen  Snoring                        YES Tiredness                    YES Observed Apnea         YES Pressure (HTN)           NO BMI >35                      NO Age > 50                     NO Neck >17"                   YES Gender (male)             YES  Total: 6/8                    Screen: POSITIVE   Plan - full NPSG - scheduled for 07/28/16 - follow up with Dr. Ardyth Manam after sleep study - diet, exercise and wt. Loss    Updated Medication List Outpatient Encounter Prescriptions as of 07/10/2016  Medication Sig  . albuterol (PROVENTIL HFA;VENTOLIN HFA) 108 (90 BASE) MCG/ACT inhaler Inhale 2 puffs into the lungs every 6 (six) hours as needed for wheezing or shortness of breath.  . losartan (COZAAR) 50 MG tablet Take 1 tablet (50 mg total) by mouth daily.  . Multiple Vitamin (MULTIVITAMIN) tablet Take 1 tablet by mouth daily.    . Omega-3 Fatty Acids (FISH OIL PO) Take 2 capsules by mouth every evening.  . Red Yeast Rice Extract (RED YEAST RICE PO) Take 2 capsules by mouth 2 (two) times daily.  . vitamin C (ASCORBIC ACID) 500 MG tablet Take 500 mg by mouth daily.    . [DISCONTINUED] sulfamethoxazole-trimethoprim (BACTRIM DS,SEPTRA DS) 800-160 MG tablet Take 2 tablets by mouth 2 (two) times daily.   No facility-administered encounter medications on file as of 07/10/2016.     Orders for this visit: No orders of the defined types were placed in this encounter.   Thank  you for the visitation and for allowing  [facility] to assist in the care of your patient. Our recommendations are noted above.  Please contact us if we can be of further service.  Stephanie AcreVishal Saniyah Mondesir, MD  Pulmonary and Critical Care Office Number: [phone]  Note: This note was prepared with Dragon dictation along with smaller phrase technology. Any transcriptional errors that result  from this process are unintentional.

## 2016-07-17 ENCOUNTER — Ambulatory Visit: Payer: Self-pay | Admitting: Internal Medicine

## 2016-08-07 ENCOUNTER — Encounter: Payer: Self-pay | Admitting: Internal Medicine

## 2016-08-07 DIAGNOSIS — G479 Sleep disorder, unspecified: Secondary | ICD-10-CM

## 2016-08-08 DIAGNOSIS — G4733 Obstructive sleep apnea (adult) (pediatric): Secondary | ICD-10-CM | POA: Diagnosis not present

## 2016-08-10 ENCOUNTER — Ambulatory Visit: Payer: BLUE CROSS/BLUE SHIELD | Admitting: Family Medicine

## 2016-08-10 ENCOUNTER — Ambulatory Visit: Payer: BLUE CROSS/BLUE SHIELD | Admitting: Primary Care

## 2016-08-11 ENCOUNTER — Ambulatory Visit (INDEPENDENT_AMBULATORY_CARE_PROVIDER_SITE_OTHER): Payer: BLUE CROSS/BLUE SHIELD | Admitting: Primary Care

## 2016-08-11 ENCOUNTER — Encounter: Payer: Self-pay | Admitting: Primary Care

## 2016-08-11 VITALS — BP 144/86 | HR 87 | Temp 98.0°F | Ht 69.0 in | Wt 238.4 lb

## 2016-08-11 DIAGNOSIS — L03115 Cellulitis of right lower limb: Secondary | ICD-10-CM | POA: Diagnosis not present

## 2016-08-11 MED ORDER — DOXYCYCLINE HYCLATE 100 MG PO TABS: 100.0000 mg | ORAL_TABLET | Freq: Two times a day (BID) | ORAL | 0 refills | Status: DC

## 2016-08-11 NOTE — Patient Instructions (Signed)
Start Doxycycline antibiotic. Take 1 tablet by mouth twice daily for 10 days.  Apply warm compresses to the wounds to facilitate drainage.   Please notify us if no improvement in 3-4 days, you develop fevers of 101, you start to feel worse.  It was a pleasure meeting you!

## 2016-08-11 NOTE — Progress Notes (Signed)
Pre visit review using our clinic review tool, if applicable. No additional management support is needed unless otherwise documented below in the visit note. 

## 2016-08-11 NOTE — Progress Notes (Signed)
Subjective:    Patient ID: Gwendolyn LimaJeremy Hurn, male    DOB: [birth year], 45 y.o.   MRN: [id number]  HPI  Mr. Ilene QuaCordova is a 45 year old male who presents today with a chief complaint of skin infection. He was evaluated on 06/22/16 with a chief complaint of scrotal boil. He was treated for scrotal abscess with a course of Bactrim DS tablets for 14 days and encouraged to follow up if no improvement.  Since that visit he's noticed the scrotal boil recurred 2 days ago. He's also noticed a wound with erythema and pain to his right medial calf which is most bothersome He's been applying neosporin without improvement. He denies itching, fevers, palpitations.   Review of Systems  Constitutional: Negative for chills, fatigue and fever.  Genitourinary: Negative for penile swelling and scrotal swelling.  Musculoskeletal: Negative for arthralgias.  Skin: Positive for color change and wound.       Past Medical History:  Diagnosis Date  . Asthma    mild; cough variant  . 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 HLD (hyperlipidemia)   . 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 Hypertension    Patient states he takes Losartin.  . Pain in joint, lower leg    patello-femoral syndrome  . 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 Marital status: Married    Spouse name: N/A  . Number of children: 3  . Years of education: N/A   Occupational History  . Principal    Social History Main Topics  . Smoking status: Never Smoker  . Smokeless tobacco: Never Used  . Alcohol use No  . Drug use: No  . Sexual activity: Not on file   Other Topics Concern  . Not on file   Social History Narrative   [place]      School principal, [employer]      Wife; 3 kids      No regular exercise (<3 times/week)      Plays tennis 1/week    Past Surgical History:  Procedure Laterality Date  . WISDOM TOOTH  EXTRACTION      Family History  Problem Relation Age of Onset  . Arthritis      family history  . Hypertension      family history  . Coronary artery disease      grandparents  . Cancer Maternal Grandmother     lung    Allergies  Allergen Reactions  . Aspirin     REACTION: hives- childhood reaction    Current Outpatient Prescriptions on File Prior to Visit  Medication Sig Dispense Refill  . albuterol (PROVENTIL HFA;VENTOLIN HFA) 108 (90 BASE) MCG/ACT inhaler Inhale 2 puffs into the lungs every 6 (six) hours as needed for wheezing or shortness of breath. 1 Inhaler 2  . losartan (COZAAR) 50 MG tablet Take 1 tablet (50 mg total) by mouth daily. 90 tablet 3  . Multiple Vitamin (MULTIVITAMIN) tablet Take 1 tablet by mouth daily.      . Omega-3 Fatty Acids (FISH OIL PO) Take 2 capsules by mouth every evening.    . Red Yeast Rice Extract (RED YEAST RICE PO) Take 2 capsules by mouth 2 (two) times daily.    . vitamin C (ASCORBIC ACID) 500 MG tablet Take 500 mg by mouth daily.       No current facility-administered medications on file prior to visit.  BP (!) 144/86   Pulse 87   Temp 98 F (36.7 C) (Oral)   Ht 5\' 9"  (1.753 m)   Wt 238 lb 6.4 oz (108.1 kg)   SpO2 98%   BMI 35.21 kg/m    Objective:   Physical Exam  Constitutional: He appears well-nourished.  Neck: Neck supple.  Cardiovascular: Normal rate and regular rhythm.   Pulmonary/Chest: Effort normal and breath sounds normal.  Skin: There is erythema.  2 cm circular cellulitis with crusted center, no drainage to right medial calf. Tender. Deep.           Assessment & Plan:  Cellulitis:  Recurrent wound to scrotum and new wound to right medial calf x 2 days. Rapidly growing without improvement with OTC products. Exam today with evidence of cellulitis to right lower extremity. Rx for Doxycycline course sent to pharmacy. Discussed application of warm compresses to facilitate drainage. Return precautions  provided.  Morrie Sheldonlark,Jarell Mcewen Kendal, NP

## 2016-08-22 ENCOUNTER — Telehealth: Payer: Self-pay

## 2016-08-22 DIAGNOSIS — G4733 Obstructive sleep apnea (adult) (pediatric): Secondary | ICD-10-CM

## 2016-08-22 NOTE — Telephone Encounter (Signed)
Spoke with pt's wife (dpr) and made her aware of sleep study results. Pt's wife states she will have pt give us a call back. We will hold off on placing auto pap per office protocol until we have spoke with pt. Will await call back.

## 2016-08-23 NOTE — Telephone Encounter (Signed)
Order has been placed for auto cpap per office protocol. Pt aware and voiced his understanding with no further questions. Nothing further needed.

## 2016-08-23 NOTE — Telephone Encounter (Signed)
Pt retuning our call 10-11 am pt states he will not be available  Please call back

## 2016-09-15 ENCOUNTER — Other Ambulatory Visit: Payer: Self-pay | Admitting: *Deleted

## 2016-09-15 MED ORDER — LOSARTAN POTASSIUM 50 MG PO TABS: 50.0000 mg | ORAL_TABLET | Freq: Every day | ORAL | 0 refills | Status: DC

## 2016-09-18 ENCOUNTER — Ambulatory Visit: Payer: BLUE CROSS/BLUE SHIELD | Admitting: Internal Medicine

## 2016-09-19 ENCOUNTER — Ambulatory Visit (INDEPENDENT_AMBULATORY_CARE_PROVIDER_SITE_OTHER): Payer: BLUE CROSS/BLUE SHIELD | Admitting: Internal Medicine

## 2016-09-19 ENCOUNTER — Encounter: Payer: Self-pay | Admitting: Internal Medicine

## 2016-09-19 VITALS — BP 108/82 | HR 70 | Temp 98.1°F | Wt 236.0 lb

## 2016-09-19 DIAGNOSIS — L03314 Cellulitis of groin: Secondary | ICD-10-CM

## 2016-09-19 DIAGNOSIS — G4733 Obstructive sleep apnea (adult) (pediatric): Secondary | ICD-10-CM | POA: Diagnosis not present

## 2016-09-19 MED ORDER — DOXYCYCLINE HYCLATE 100 MG PO TABS: 100.0000 mg | ORAL_TABLET | Freq: Two times a day (BID) | ORAL | 2 refills | Status: DC

## 2016-09-19 MED ORDER — MUPIROCIN 2 % EX OINT: 1.0000 "application " | TOPICAL_OINTMENT | Freq: Two times a day (BID) | CUTANEOUS | 0 refills | Status: DC

## 2016-09-19 NOTE — Progress Notes (Signed)
   Subjective:    Patient ID: Kerry LimaJeremy Pierce, male    DOB: 1971/02/13, 46 y.o.   MRN: [id number]  HPI Here due to recurrent scrotal infection Had infection in right calf and on scrotum These resolved with the treatment (only some residual hardness)  Recurrence of scrotal infection over the past 4 days Actually in perineum between scrotum and rectum  Current Outpatient Prescriptions on File Prior to Visit  Medication Sig Dispense Refill  . albuterol (PROVENTIL HFA;VENTOLIN HFA) 108 (90 BASE) MCG/ACT inhaler Inhale 2 puffs into the lungs every 6 (six) hours as needed for wheezing or shortness of breath. 1 Inhaler 2  . losartan (COZAAR) 50 MG tablet Take 1 tablet (50 mg total) by mouth daily. 90 tablet 0  . Multiple Vitamin (MULTIVITAMIN) tablet Take 1 tablet by mouth daily.      . Omega-3 Fatty Acids (FISH OIL PO) Take 2 capsules by mouth every evening.    . Red Yeast Rice Extract (RED YEAST RICE PO) Take 2 capsules by mouth 2 (two) times daily.    . vitamin C (ASCORBIC ACID) 500 MG tablet Take 500 mg by mouth daily.       No current facility-administered medications on file prior to visit.     Allergies  Allergen Reactions  . Aspirin     REACTION: hives- childhood reaction    Past Medical History:  Diagnosis Date  . Asthma    mild; cough variant  . 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 HLD (hyperlipidemia)   . 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 Hypertension    Patient states he takes Losartin.  . Pain in joint, lower leg    patello-femoral syndrome  . 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 WISDOM TOOTH EXTRACTION      Family History  Problem Relation Age of Onset  . Arthritis      family history  . Hypertension      family history  . Coronary artery disease      grandparents  . Cancer Maternal Grandmother     lung    Social History    Social History  . Marital status: Married    Spouse name: N/A  . Number of children: 3  . Years of education: N/A   Occupational History  . Principal    Social History Main Topics  . Smoking status: Never Smoker  . Smokeless tobacco: Never Used  . Alcohol use No  . Drug use: No  . Sexual activity: Not on file   Other Topics Concern  . Not on file   Social History Narrative   [place]      School principal, [employer] school      Wife; 3 kids      No regular exercise (<3 times/week)      Plays tennis 1/week   Review of Systems  No fever Doesn't feel sick Appetite is okay Married--3 children. None of them are affected Had sleep study---now on nasal CPAP    Objective:   Physical Exam  Skin:  Indurated, non inflamed nodule on mid right calf Linear indurated area in perineum---some redness and tenderness          Assessment & Plan:

## 2016-09-19 NOTE — Assessment & Plan Note (Signed)
AHI of 55 Now doing well on nasal CPAP (auto-titrate 5-10) Uses this nightly

## 2016-09-19 NOTE — Progress Notes (Signed)
Pre visit review using our clinic review tool, if applicable. No additional management support is needed unless otherwise documented below in the visit note. 

## 2016-09-19 NOTE — Assessment & Plan Note (Signed)
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

## 2016-09-20 ENCOUNTER — Ambulatory Visit: Payer: Self-pay | Admitting: Primary Care

## 2016-10-22 ENCOUNTER — Encounter: Payer: Self-pay | Admitting: Internal Medicine

## 2016-10-30 ENCOUNTER — Ambulatory Visit: Payer: BLUE CROSS/BLUE SHIELD | Admitting: Internal Medicine

## 2016-11-09 ENCOUNTER — Encounter: Payer: Self-pay | Admitting: Internal Medicine

## 2016-11-12 NOTE — Progress Notes (Signed)
[facility]      MRN# [id number] Gwendolyn LimaJeremy Benison 07-15-1971   The patient is a 46 year old male who is seen in follow-up with a history of pulmonary embolism, obstructive sleep apnea, lung nodule.  Obstructive sleep apnea. --Doing well with CPAP, continue cpap.   Lung nodule. -CT Chest 06/2016 Subpleural 6 mm right middle lobe pulmonary nodule is stable back to 05/10/2014 and considered benign. -No further follow-up needed for this benign lung nodule.  Pulmonary embolus. - treated by hematology oncology with warfarin x 6 months in 2016, occurred after a long trip.  --Recommend that if he goes on trips he and walk every 2 hours. He is allergic to aspirin.   CC: Chief Complaint  Patient presents with  . Follow-up    f/u CPAP: feels great. no issues with CPAP   He has been doing well with CPAP, he is using it every night and feels better. He feels more awake during the day. He has had minimal issues with adjusting to the mask. He wears it for the entire night, about 7 hours per night.  He is rinsing mask once per week.   **Review of download data. 11/09/16: Every usage is 6 hours 50 minutes, AutoSet 5-15 area residual AHI 0.8. 95th percentile pressure was 10.3.     Current Outpatient Prescriptions:  .  albuterol (PROVENTIL HFA;VENTOLIN HFA) 108 (90 BASE) MCG/ACT inhaler, Inhale 2 puffs into the lungs every 6 (six) hours as needed for wheezing or shortness of breath., Disp: 1 Inhaler, Rfl: 2 .  doxycycline (VIBRA-TABS) 100 MG tablet, Take 1 tablet (100 mg total) by mouth 2 (two) times daily., Disp: 14 tablet, Rfl: 2 .  losartan (COZAAR) 50 MG tablet, Take 1 tablet (50 mg total) by mouth daily., Disp: 90 tablet, Rfl: 0 .  Multiple Vitamin (MULTIVITAMIN) tablet, Take 1 tablet by mouth daily.  , Disp: , Rfl:  .  mupirocin ointment (BACTROBAN) 2 %, Place 1 application into the nose 2 (two) times daily., Disp: 22 g, Rfl: 0 .  Omega-3 Fatty Acids (FISH OIL  PO), Take 2 capsules by mouth every evening., Disp: , Rfl:  .  Red Yeast Rice Extract (RED YEAST RICE PO), Take 2 capsules by mouth 2 (two) times daily., Disp: , Rfl:  .  vitamin C (ASCORBIC ACID) 500 MG tablet, Take 500 mg by mouth daily.  , Disp: , Rfl:    Review of Systems  Constitutional: Positive for malaise/fatigue. Negative for chills, fever and weight loss.  Eyes: Negative for blurred vision and double vision.  Respiratory: Negative for cough.   Cardiovascular: Negative for chest pain.  Gastrointestinal: Negative for heartburn and nausea.  Genitourinary: Negative for dysuria.  Musculoskeletal: Negative for myalgias.  Skin: Negative for itching and rash.  Neurological: Negative for dizziness, tingling and headaches.  Endo/Heme/Allergies: Negative for environmental allergies. Does not bruise/bleed easily.  Psychiatric/Behavioral: Negative for depression.      Allergies:  Aspirin  Physical Examination:  VS: BP 128/78 (BP Location: Left Arm, Cuff Size: Normal)   Pulse 74   Wt 231 lb (104.8 kg)   SpO2 100%   BMI 34.11 kg/m   General Appearance: No distress  HEENT: PERRLA, no ptosis, no other lesions noticed Pulmonary:normal breath sounds., diaphragmatic excursion normal.No wheezing, No rales   Cardiovascular:  Normal S1,S2.  No m/r/g.     Abdomen:Exam: Benign, Soft, non-tender, No masses  Skin:   warm, no rashes, no ecchymosis  Extremities: normal, no cyanosis, clubbing, warm  with normal capillary refill.      Rad results: (The following images and results were reviewed by Dr. Nicholos Johns on 11/13/2016). CT Chest 06/2016 IMPRESSION: 1. Subpleural 6 mm right middle lobe pulmonary nodule is stable back to 05/10/2014 and considered benign. 2. Stable postinfectious/postinflammatory scar in the lingula . 3. No active disease in the chest.     Updated Medication List Outpatient Encounter Prescriptions as of 11/13/2016  Medication Sig  . albuterol (PROVENTIL  HFA;VENTOLIN HFA) 108 (90 BASE) MCG/ACT inhaler Inhale 2 puffs into the lungs every 6 (six) hours as needed for wheezing or shortness of breath.  . doxycycline (VIBRA-TABS) 100 MG tablet Take 1 tablet (100 mg total) by mouth 2 (two) times daily.  Marland Kitchen losartan (COZAAR) 50 MG tablet Take 1 tablet (50 mg total) by mouth daily.  . Multiple Vitamin (MULTIVITAMIN) tablet Take 1 tablet by mouth daily.    . mupirocin ointment (BACTROBAN) 2 % Place 1 application into the nose 2 (two) times daily.  . Omega-3 Fatty Acids (FISH OIL PO) Take 2 capsules by mouth every evening.  . Red Yeast Rice Extract (RED YEAST RICE PO) Take 2 capsules by mouth 2 (two) times daily.  . vitamin C (ASCORBIC ACID) 500 MG tablet Take 500 mg by mouth daily.     No facility-administered encounter medications on file as of 11/13/2016.     Orders for this visit: No orders of the defined types were placed in this encounter.   Thank  you for the visitation and for allowing  [facility] to assist in the care of your patient. Our recommendations are noted above.  Please contact us if we can be of further service.  Shane Crutch, MD [facility] Office Number: [phone]

## 2016-11-13 ENCOUNTER — Ambulatory Visit (INDEPENDENT_AMBULATORY_CARE_PROVIDER_SITE_OTHER): Payer: BLUE CROSS/BLUE SHIELD | Admitting: Internal Medicine

## 2016-11-13 ENCOUNTER — Encounter: Payer: Self-pay | Admitting: Internal Medicine

## 2016-11-13 VITALS — BP 128/78 | HR 74 | Wt 231.0 lb

## 2016-11-13 DIAGNOSIS — G4733 Obstructive sleep apnea (adult) (pediatric): Secondary | ICD-10-CM

## 2016-11-13 NOTE — Patient Instructions (Signed)
--  Adjust pressure range to 8-14.

## 2016-12-28 ENCOUNTER — Encounter: Payer: Self-pay | Admitting: Internal Medicine

## 2016-12-28 ENCOUNTER — Ambulatory Visit (INDEPENDENT_AMBULATORY_CARE_PROVIDER_SITE_OTHER): Payer: BLUE CROSS/BLUE SHIELD | Admitting: Internal Medicine

## 2016-12-28 VITALS — BP 116/74 | HR 70 | Temp 98.2°F | Wt 234.8 lb

## 2016-12-28 DIAGNOSIS — E785 Hyperlipidemia, unspecified: Secondary | ICD-10-CM

## 2016-12-28 DIAGNOSIS — I10 Essential (primary) hypertension: Secondary | ICD-10-CM

## 2016-12-28 LAB — CBC WITH DIFFERENTIAL/PLATELET
BASOS ABS: 0.1 10*3/uL (ref 0.0–0.1)
Basophils Relative: 1 % (ref 0.0–3.0)
Eosinophils Absolute: 0.2 10*3/uL (ref 0.0–0.7)
Eosinophils Relative: 1.6 % (ref 0.0–5.0)
HEMATOCRIT: 40.2 % (ref 39.0–52.0)
HEMOGLOBIN: 13.9 g/dL (ref 13.0–17.0)
LYMPHS PCT: 21.9 % (ref 12.0–46.0)
Lymphs Abs: 2.3 10*3/uL (ref 0.7–4.0)
MCHC: 34.5 g/dL (ref 30.0–36.0)
MCV: 83.6 fl (ref 78.0–100.0)
MONOS PCT: 7.8 % (ref 3.0–12.0)
Monocytes Absolute: 0.8 10*3/uL (ref 0.1–1.0)
NEUTROS ABS: 7.2 10*3/uL (ref 1.4–7.7)
NEUTROS PCT: 67.7 % (ref 43.0–77.0)
Platelets: 298 10*3/uL (ref 150.0–400.0)
RBC: 4.81 Mil/uL (ref 4.22–5.81)
RDW: 13.9 % (ref 11.5–15.5)
WBC: 10.6 10*3/uL — ABNORMAL HIGH (ref 4.0–10.5)

## 2016-12-28 LAB — COMPREHENSIVE METABOLIC PANEL
ALT: 21 U/L (ref 0–53)
AST: 16 U/L (ref 0–37)
Albumin: 4.4 g/dL (ref 3.5–5.2)
Alkaline Phosphatase: 66 U/L (ref 39–117)
BILIRUBIN TOTAL: 0.6 mg/dL (ref 0.2–1.2)
BUN: 12 mg/dL (ref 6–23)
CALCIUM: 9.3 mg/dL (ref 8.4–10.5)
CO2: 31 meq/L (ref 19–32)
Chloride: 102 mEq/L (ref 96–112)
Creatinine, Ser: 0.97 mg/dL (ref 0.40–1.50)
GFR: 88.64 mL/min (ref >60.00)
Glucose, Bld: 117 mg/dL — ABNORMAL HIGH (ref 70–99)
Potassium: 3.6 mEq/L (ref 3.5–5.1)
Sodium: 139 mEq/L (ref 135–145)
Total Protein: 7.2 g/dL (ref 6.0–8.3)

## 2016-12-28 LAB — LIPID PANEL
Cholesterol: 212 mg/dL — ABNORMAL HIGH (ref 0–200)
HDL: 39.2 mg/dL (ref >39.00)
NonHDL: 172.91
TRIGLYCERIDES: 216 mg/dL — AB (ref 0.0–149.0)
Total CHOL/HDL Ratio: 5
VLDL: 43.2 mg/dL — ABNORMAL HIGH (ref 0.0–40.0)

## 2016-12-28 LAB — LDL CHOLESTEROL, DIRECT: Direct LDL: 141 mg/dL

## 2016-12-28 MED ORDER — LOSARTAN POTASSIUM 50 MG PO TABS: 50.0000 mg | ORAL_TABLET | Freq: Every day | ORAL | 3 refills | Status: DC

## 2016-12-28 NOTE — Progress Notes (Signed)
Pre visit review using our clinic review tool, if applicable. No additional management support is needed unless otherwise documented below in the visit note. 

## 2016-12-28 NOTE — Assessment & Plan Note (Addendum)
BP Readings from Last 3 Encounters:  12/28/16 116/74  11/13/16 128/78  09/19/16 108/82   Good control Due for labs--not completely fasting

## 2016-12-28 NOTE — Progress Notes (Signed)
Subjective:    Patient ID: Kerry Pierce, male    DOB: 1970/11/10, 46 y.o.   MRN: [id number]  HPI Here for refills and follow up of chronic health conditions  Losartan for BP Has monitored---it is under 120/80 mostly Not much exercise--trying to walk No chest pain No SOB No dizziness or syncope No edema  On red yeast rice and fish oil for cholesterol  Sleeps well with CPAP Auto titrate 4-12cm Uses all the time and really likes it  Current Outpatient Prescriptions on File Prior to Visit  Medication Sig Dispense Refill  . albuterol (PROVENTIL HFA;VENTOLIN HFA) 108 (90 BASE) MCG/ACT inhaler Inhale 2 puffs into the lungs every 6 (six) hours as needed for wheezing or shortness of breath. 1 Inhaler 2  . losartan (COZAAR) 50 MG tablet Take 1 tablet (50 mg total) by mouth daily. 90 tablet 0  . Multiple Vitamin (MULTIVITAMIN) tablet Take 1 tablet by mouth daily.      . Omega-3 Fatty Acids (FISH OIL PO) Take 2 capsules by mouth every evening.    . Red Yeast Rice Extract (RED YEAST RICE PO) Take 2 capsules by mouth 2 (two) times daily.    . vitamin C (ASCORBIC ACID) 500 MG tablet Take 500 mg by mouth daily.       No current facility-administered medications on file prior to visit.     Allergies  Allergen Reactions  . Aspirin     REACTION: hives- childhood reaction    Past Medical History:  Diagnosis Date  . Asthma    mild; cough variant  . 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 HLD (hyperlipidemia)   . 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 Hypertension    Patient states he takes Losartin.  . Pain in joint, lower leg    patello-femoral syndrome  . 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 WISDOM TOOTH EXTRACTION      Family History  Problem Relation Age of Onset  . Arthritis Unknown        family history  . Hypertension Unknown        family history    . Coronary artery disease Unknown        grandparents  . Cancer Maternal Grandmother        lung    Social History   Social History  . Marital status: Married    Spouse name: N/A  . Number of children: 3  . Years of education: N/A   Occupational History  . Principal    Social History Main Topics  . Smoking status: Never Smoker  . Smokeless tobacco: Never Used  . Alcohol use No  . Drug use: No  . Sexual activity: Not on file   Other Topics Concern  . Not on file   Social History Narrative   From [employer] principal, [employer]      Wife; 3 kids      No regular exercise (<3 times/week)      Plays tennis 1/week    Review of Systems Appetite is good Weight about the same Bowels are fine No urinary problems No recurrence of infection    Objective:   Physical Exam  Constitutional: He appears well-nourished. No distress.  HENT:  Mouth/Throat: Oropharynx is clear and moist. No oropharyngeal exudate.  Neck: No thyromegaly present.  Cardiovascular: Normal rate, regular rhythm, normal  heart sounds and intact distal pulses.  Exam reveals no gallop.   No murmur heard. Pulmonary/Chest: Effort normal and breath sounds normal. No respiratory distress. He has no wheezes. He has no rales.  Musculoskeletal: He exhibits no edema.  Lymphadenopathy:    He has no cervical adenopathy.  Psychiatric: He has a normal mood and affect. His behavior is normal.          Assessment & Plan:

## 2016-12-28 NOTE — Assessment & Plan Note (Signed)
On OTC meds 

## 2016-12-28 NOTE — Patient Instructions (Signed)
DASH Eating Plan DASH stands for "Dietary Approaches to Stop Hypertension." The DASH eating plan is a healthy eating plan that has been shown to reduce high blood pressure (hypertension). It may also reduce your risk for type 2 diabetes, heart disease, and stroke. The DASH eating plan may also help with weight loss. What are tips for following this plan? General guidelines  Avoid eating more than 2,300 mg (milligrams) of salt (sodium) a day. If you have hypertension, you may need to reduce your sodium intake to 1,500 mg a day.  Limit alcohol intake to no more than 1 drink a day for nonpregnant women and 2 drinks a day for men. One drink equals 12 oz of beer, 5 oz of wine, or 1 oz of hard liquor.  Work with your health care provider to maintain a healthy body weight or to lose weight. Ask what an ideal weight is for you.  Get at least 30 minutes of exercise that causes your heart to beat faster (aerobic exercise) most days of the week. Activities may include walking, swimming, or biking.  Work with your health care provider or diet and nutrition specialist (dietitian) to adjust your eating plan to your individual calorie needs. Reading food labels  Check food labels for the amount of sodium per serving. Choose foods with less than 5 percent of the Daily Value of sodium. Generally, foods with less than 300 mg of sodium per serving fit into this eating plan.  To find whole grains, look for the word "whole" as the first word in the ingredient list. Shopping  Buy products labeled as "low-sodium" or "no salt added."  Buy fresh foods. Avoid canned foods and premade or frozen meals. Cooking  Avoid adding salt when cooking. Use salt-free seasonings or herbs instead of table salt or sea salt. Check with your health care provider or pharmacist before using salt substitutes.  Do not fry foods. Cook foods using healthy methods such as baking, boiling, grilling, and broiling instead.  Cook with  heart-healthy oils, such as olive, canola, soybean, or sunflower oil. Meal planning   Eat a balanced diet that includes: ? 5 or more servings of fruits and vegetables each day. At each meal, try to fill half of your plate with fruits and vegetables. ? Up to 6-8 servings of whole grains each day. ? Less than 6 oz of lean meat, poultry, or fish each day. A 3-oz serving of meat is about the same size as a deck of cards. One egg equals 1 oz. ? 2 servings of low-fat dairy each day. ? A serving of nuts, seeds, or beans 5 times each week. ? Heart-healthy fats. Healthy fats called Omega-3 fatty acids are found in foods such as flaxseeds and coldwater fish, like sardines, salmon, and mackerel.  Limit how much you eat of the following: ? Canned or prepackaged foods. ? Food that is high in trans fat, such as fried foods. ? Food that is high in saturated fat, such as fatty meat. ? Sweets, desserts, sugary drinks, and other foods with added sugar. ? Full-fat dairy products.  Do not salt foods before eating.  Try to eat at least 2 vegetarian meals each week.  Eat more home-cooked food and less restaurant, buffet, and fast food.  When eating at a restaurant, ask that your food be prepared with less salt or no salt, if possible. What foods are recommended? The items listed may not be a complete list. Talk with your dietitian about what   dietary choices are best for you. Grains Whole-grain or whole-wheat bread. Whole-grain or whole-wheat pasta. Brown rice. Oatmeal. Quinoa. Bulgur. Whole-grain and low-sodium cereals. Pita bread. Low-fat, low-sodium crackers. Whole-wheat flour tortillas. Vegetables Fresh or frozen vegetables (raw, steamed, roasted, or grilled). Low-sodium or reduced-sodium tomato and vegetable juice. Low-sodium or reduced-sodium tomato sauce and tomato paste. Low-sodium or reduced-sodium canned vegetables. Fruits All fresh, dried, or frozen fruit. Canned fruit in natural juice (without  added sugar). Meat and other protein foods Skinless chicken or turkey. Ground chicken or turkey. Pork with fat trimmed off. Fish and seafood. Egg whites. Dried beans, peas, or lentils. Unsalted nuts, nut butters, and seeds. Unsalted canned beans. Lean cuts of beef with fat trimmed off. Low-sodium, lean deli meat. Dairy Low-fat (1%) or fat-free (skim) milk. Fat-free, low-fat, or reduced-fat cheeses. Nonfat, low-sodium ricotta or cottage cheese. Low-fat or nonfat yogurt. Low-fat, low-sodium cheese. Fats and oils Soft margarine without trans fats. Vegetable oil. Low-fat, reduced-fat, or light mayonnaise and salad dressings (reduced-sodium). Canola, safflower, olive, soybean, and sunflower oils. Avocado. Seasoning and other foods Herbs. Spices. Seasoning mixes without salt. Unsalted popcorn and pretzels. Fat-free sweets. What foods are not recommended? The items listed may not be a complete list. Talk with your dietitian about what dietary choices are best for you. Grains Baked goods made with fat, such as croissants, muffins, or some breads. Dry pasta or rice meal packs. Vegetables Creamed or fried vegetables. Vegetables in a cheese sauce. Regular canned vegetables (not low-sodium or reduced-sodium). Regular canned tomato sauce and paste (not low-sodium or reduced-sodium). Regular tomato and vegetable juice (not low-sodium or reduced-sodium). Pickles. Olives. Fruits Canned fruit in a light or heavy syrup. Fried fruit. Fruit in cream or butter sauce. Meat and other protein foods Fatty cuts of meat. Ribs. Fried meat. Bacon. Sausage. Bologna and other processed lunch meats. Salami. Fatback. Hotdogs. Bratwurst. Salted nuts and seeds. Canned beans with added salt. Canned or smoked fish. Whole eggs or egg yolks. Chicken or turkey with skin. Dairy Whole or 2% milk, cream, and half-and-half. Whole or full-fat cream cheese. Whole-fat or sweetened yogurt. Full-fat cheese. Nondairy creamers. Whipped toppings.  Processed cheese and cheese spreads. Fats and oils Butter. Stick margarine. Lard. Shortening. Ghee. Bacon fat. Tropical oils, such as coconut, palm kernel, or palm oil. Seasoning and other foods Salted popcorn and pretzels. Onion salt, garlic salt, seasoned salt, table salt, and sea salt. Worcestershire sauce. Tartar sauce. Barbecue sauce. Teriyaki sauce. Soy sauce, including reduced-sodium. Steak sauce. Canned and packaged gravies. Fish sauce. Oyster sauce. Cocktail sauce. Horseradish that you find on the shelf. Ketchup. Mustard. Meat flavorings and tenderizers. Bouillon cubes. Hot sauce and Tabasco sauce. Premade or packaged marinades. Premade or packaged taco seasonings. Relishes. Regular salad dressings. Where to find more information:  National Heart, Lung, and Blood Institute: www.nhlbi.nih.gov  American Heart Association: www.heart.org Summary  The DASH eating plan is a healthy eating plan that has been shown to reduce high blood pressure (hypertension). It may also reduce your risk for type 2 diabetes, heart disease, and stroke.  With the DASH eating plan, you should limit salt (sodium) intake to 2,300 mg a day. If you have hypertension, you may need to reduce your sodium intake to 1,500 mg a day.  When on the DASH eating plan, aim to eat more fresh fruits and vegetables, whole grains, lean proteins, low-fat dairy, and heart-healthy fats.  Work with your health care provider or diet and nutrition specialist (dietitian) to adjust your eating plan to your individual   calorie needs. This information is not intended to replace advice given to you by your health care provider. Make sure you discuss any questions you have with your health care provider. Document Released: 07/27/2011 Document Revised: 07/31/2016 Document Reviewed: 07/31/2016 Elsevier Interactive Patient Education  2017 Elsevier Inc.  

## 2017-08-23 ENCOUNTER — Ambulatory Visit: Payer: Self-pay | Admitting: General Practice

## 2017-09-12 ENCOUNTER — Telehealth: Payer: Self-pay | Admitting: Internal Medicine

## 2017-09-12 NOTE — Telephone Encounter (Signed)
lmov to schedule an appt in march

## 2017-09-12 NOTE — Telephone Encounter (Signed)
Scheduled

## 2017-11-13 ENCOUNTER — Encounter: Payer: Self-pay | Admitting: Internal Medicine

## 2017-11-14 ENCOUNTER — Encounter: Payer: Self-pay | Admitting: Internal Medicine

## 2017-11-14 ENCOUNTER — Ambulatory Visit: Payer: BLUE CROSS/BLUE SHIELD | Admitting: Internal Medicine

## 2017-11-14 VITALS — BP 126/78 | HR 80 | Ht 69.0 in

## 2017-11-14 DIAGNOSIS — G4733 Obstructive sleep apnea (adult) (pediatric): Secondary | ICD-10-CM | POA: Diagnosis not present

## 2017-11-14 NOTE — Progress Notes (Signed)
[facility]      MRN# [id number] Kerry LimaJeremy Pierce 01-02-1971   The patient is a 47 year old male who is seen in follow-up with a history of pulmonary embolism, obstructive sleep apnea, lung nodule.  Obstructive sleep apnea. --Doing well with CPAP, continue cpap.   Lung nodule. -CT Chest 06/2016 Subpleural 6 mm right middle lobe pulmonary nodule is stable back to 05/10/2014 and considered benign. -No further follow-up needed for this benign lung nodule.  Pulmonary embolus. - treated by hematology oncology with warfarin x 6 months in 2016, occurred after a long trip.  --Recommend that if he goes on trips he get up and walk every 2 hours. He is allergic to aspirin.   CC: Chief Complaint  Patient presents with  . Sleep Apnea    DME:[facility] pt wear cpap nightly average 5-8 hrs. He does not have any complaints.   He has been doing well with CPAP, he is using it every night and feels better. He is using it every night and feels that he is doing well with is, he is more awake during the day and feels rested. He is rinsing mask once per week.   **Review of download data 30 days as of 11/13/17, personally reviewed, uses greater than 4 hours is 30/30 days.  Average usage on days used is 6 hours 41 minutes.  Pressure ranges 8-14.  Median pressure is 9, 95th percentile pressure is 12, maximum pressure 13.  Residual AHI 0.8.  **Review of download data. 11/09/16: Every usage is 6 hours 50 minutes, AutoSet 5-15 area residual AHI 0.8. 95th percentile pressure was 10.3.    Current Outpatient Medications:  .  albuterol (PROVENTIL HFA;VENTOLIN HFA) 108 (90 BASE) MCG/ACT inhaler, Inhale 2 puffs into the lungs every 6 (six) hours as needed for wheezing or shortness of breath., Disp: 1 Inhaler, Rfl: 2 .  losartan (COZAAR) 50 MG tablet, Take 1 tablet (50 mg total) by mouth daily., Disp: 90 tablet, Rfl: 3 .  Multiple Vitamin (MULTIVITAMIN) tablet, Take 1 tablet by mouth daily.   , Disp: , Rfl:  .  Omega-3 Fatty Acids (FISH OIL PO), Take 2 capsules by mouth every evening., Disp: , Rfl:  .  Red Yeast Rice Extract (RED YEAST RICE PO), Take 2 capsules by mouth 2 (two) times daily., Disp: , Rfl:  .  vitamin C (ASCORBIC ACID) 500 MG tablet, Take 500 mg by mouth daily.  , Disp: , Rfl:    Review of Systems  Constitutional: Positive for malaise/fatigue. Negative for chills, fever and weight loss.  Eyes: Negative for blurred vision and double vision.  Respiratory: Negative for cough.   Cardiovascular: Negative for chest pain.  Gastrointestinal: Negative for heartburn and nausea.  Genitourinary: Negative for dysuria.  Musculoskeletal: Negative for myalgias.  Skin: Negative for itching and rash.  Neurological: Negative for dizziness, tingling and headaches.  Endo/Heme/Allergies: Negative for environmental allergies. Does not bruise/bleed easily.  Psychiatric/Behavioral: Negative for depression.      Allergies:  Aspirin  Physical Examination:  VS: BP 126/78 (BP Location: Left Arm, Cuff Size: Large)   Pulse 80   Ht 5\' 9"  (1.753 m)   SpO2 97%   BMI 34.67 kg/m   General Appearance: No distress  HEENT: PERRLA, no ptosis, no other lesions noticed Pulmonary:normal breath sounds., diaphragmatic excursion normal.No wheezing, No rales   Cardiovascular:  Normal S1,S2.  No m/r/g.     Abdomen:Exam: Benign, Soft, non-tender, No masses  Skin:   warm, no rashes,  no ecchymosis  Extremities: normal, no cyanosis, clubbing, warm with normal capillary refill.      Rad results: (The following images and results were reviewed by Dr. Nicholos Johns on 11/14/2017). CT Chest 06/2016 IMPRESSION: 1. Subpleural 6 mm right middle lobe pulmonary nodule is stable back to 05/10/2014 and considered benign. 2. Stable postinfectious/postinflammatory scar in the lingula . 3. No active disease in the chest.     Updated Medication List Outpatient Encounter Medications as of 11/14/2017    Medication Sig  . albuterol (PROVENTIL HFA;VENTOLIN HFA) 108 (90 BASE) MCG/ACT inhaler Inhale 2 puffs into the lungs every 6 (six) hours as needed for wheezing or shortness of breath.  . losartan (COZAAR) 50 MG tablet Take 1 tablet (50 mg total) by mouth daily.  . Multiple Vitamin (MULTIVITAMIN) tablet Take 1 tablet by mouth daily.    . Omega-3 Fatty Acids (FISH OIL PO) Take 2 capsules by mouth every evening.  . Red Yeast Rice Extract (RED YEAST RICE PO) Take 2 capsules by mouth 2 (two) times daily.  . vitamin C (ASCORBIC ACID) 500 MG tablet Take 500 mg by mouth daily.     No facility-administered encounter medications on file as of 11/14/2017.     Orders for this visit: No orders of the defined types were placed in this encounter.   Thank  you for the visitation and for allowing  [facility] to assist in the care of your patient. Our recommendations are noted above.  Please contact us if we can be of further service.  Shane Crutch, MD [facility] Office Number: [phone]

## 2017-11-14 NOTE — Patient Instructions (Addendum)
We continue current pressure range, continue using CPAP every night.  When you are on an extended trip recommend that you do leg exercises every 2 hours.

## 2017-11-19 ENCOUNTER — Encounter: Payer: Self-pay | Admitting: Family Medicine

## 2017-11-19 ENCOUNTER — Other Ambulatory Visit: Payer: Self-pay

## 2017-11-19 ENCOUNTER — Ambulatory Visit: Payer: BLUE CROSS/BLUE SHIELD | Admitting: Family Medicine

## 2017-11-19 VITALS — BP 114/70 | HR 62 | Temp 98.5°F | Ht 68.0 in | Wt 241.5 lb

## 2017-11-19 DIAGNOSIS — L02415 Cutaneous abscess of right lower limb: Secondary | ICD-10-CM | POA: Diagnosis not present

## 2017-11-19 MED ORDER — DOXYCYCLINE HYCLATE 100 MG PO TABS: 100.0000 mg | ORAL_TABLET | Freq: Two times a day (BID) | ORAL | 0 refills | Status: DC

## 2017-11-19 NOTE — Progress Notes (Signed)
Dr. Karleen Hampshire T. Shantele Reller, MD, CAQ [facility] [address] [place], [address] Phone: [phone] Fax: [phone]  11/19/2017  Patient: Kerry Pierce, MRN: [id number], DOB: 04/16/1971, 47 y.o.  Primary Physician:  Hannah Beat, MD   Chief Complaint  Patient presents with  . Recurrent Skin Infections    Right Hip   Subjective:   Ryu Cerreta is a 47 y.o. very pleasant male patient who presents with the following:  R lateral hip abscess: He has had several skin infections over the last 1-2 years.  He has had a history of MRSA.  In the past he has been treated successfully with doxycycline.  Today, he has a hard, tender, red and warm area on the lateral aspect of his hip on the right side.  She is been present over the last few days and worsening.  It is hard.  It has expressed that a very small amount of pus.  I and D   Past Medical History, Surgical History, Social History, Family History, Problem List, Medications, and Allergies have been reviewed and updated if relevant.  Patient Active Problem List   Diagnosis Date Noted  . 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 Obstructive sleep apnea 11/11/2014  . Essential hypertension, benign 09/03/2014  . Solitary pulmonary nodule 05/20/2014  . 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 Hyperlipemia 06/25/2008  . COUGH VARIANT ASTHMA 06/25/2008    Past Medical History:  Diagnosis Date  . Asthma    mild; cough variant  . 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 HLD (hyperlipidemia)   . 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 Hypertension    Patient states he takes Losartin.  . Pain in joint, lower leg    patello-femoral syndrome  . 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 WISDOM TOOTH EXTRACTION      Social History    Socioeconomic History  . Marital status: Married    Spouse name: Not on file  . Number of children: 3  . Years of education: Not on file  . Highest education level: Not on file  Occupational History  . Occupation: Principal  Social Needs  . Financial resource strain: Not on file  . Food insecurity:    Worry: Not on file    Inability: Not on file  . Transportation needs:    Medical: Not on file    Non-medical: Not on file  Tobacco Use  . Smoking status: Never Smoker  . Smokeless tobacco: Never Used  Substance and Sexual Activity  . Alcohol use: No  . Drug use: No  . Sexual activity: Not on file  Lifestyle  . Physical activity:    Days per week: Not on file    Minutes per session: Not on file  . Stress: Not on file  Relationships  . Social connections:    Talks on phone: Not on file    Gets together: Not on file    Attends religious service: Not on file    Active member of club or organization: Not on file    Attends meetings of clubs or organizations: Not on file    Relationship status: Not on file  . Intimate partner violence:    Fear of current or ex partner: Not on file    Emotionally abused: Not on file    Physically abused:  Not on file    Forced sexual activity: Not on file  Other Topics Concern  . Not on file  Social History Narrative   [place]      School principal, [employer]      Wife; 3 kids      No regular exercise (<3 times/week)      Plays tennis 1/week    Family History  Problem Relation Age of Onset  . Arthritis Unknown        family history  . Hypertension Unknown        family history  . Coronary artery disease Unknown        grandparents  . Cancer Maternal Grandmother        lung    Allergies  Allergen Reactions  . Aspirin     REACTION: hives- childhood reaction    Medication list reviewed and updated in full in [facility].  ROS: GEN: Acute illness details above GI: Tolerating PO intake GU: maintaining  adequate hydration and urination Pulm: No SOB Interactive and getting along well at home.  Otherwise, ROS is as per the HPI.  Objective:   BP 114/70   Pulse 62   Temp 98.5 F (36.9 C) (Oral)   Ht 5\' 8"  (1.727 m)   Wt 241 lb 8 oz (109.5 kg)   BMI 36.72 kg/m    GEN: WDWN, NAD, Non-toxic, A & O x 3 HEENT: Atraumatic, Normocephalic. Neck supple. No masses, No LAD. Ears and Nose: No external deformity. CV: RRR, No M/G/R. No JVD. No thrill. No extra heart sounds. PULM: CTA B, no wheezes, crackles, rhonchi. No retractions. No resp. distress. No accessory muscle use. EXTR: No c/c/e NEURO Normal gait.  PSYCH: Normally interactive. Conversant. Not depressed or anxious appearing.  Calm demeanor.    SKIN: Right lateral thigh with a reddened area that is indurated approximately 3-1/2 x 3-1/2 cm across.  There is a modest area of fluctuance centrally.   Laboratory and Imaging Data:  Assessment and Plan:   Abscess of right hip - Plan: WOUND CULTURE  He is going out of town in 3 or 4 days, and I think that this is drainable.  I am going to do an incision and drainage as well as place him on some oral antibiotics.  We will include MRSA coverage.  I&D Indication: suspect abscess Pt complaints of: erythema, pain, swelling Location: R lateral hip Size: 3 1/2 x 3 1/2 cm Verbal informed consent obtained.  Pt aware of risks not limited to but including infection, bleeding, damage to near by organs. Prep: etoh/betadine Anesthesia: 1%lidocaine with epi, good effect Incision made with #11 blade Would explored and loculations removed Amount expressed: 5 cc Wound packed with iodoform gauze Tolerated well Routine postprocedure instructions d/w pt- remove packing in 24-48h, keep area clean and bandaged, follow up if concerns/spreading erythema/pain.   Follow-up: if has worsening only  Meds ordered this encounter  Medications  . doxycycline (VIBRA-TABS) 100 MG tablet    Sig: Take 1 tablet  (100 mg total) by mouth 2 (two) times daily for 10 days.    Dispense:  20 tablet    Refill:  0   Orders Placed This Encounter  Procedures  . WOUND CULTURE    Signed,  Orlin Kann T. Samuell Knoble, MD   Allergies as of 11/19/2017      Reactions   Aspirin    REACTION: hives- childhood reaction      Medication List  Accurate as of 11/19/17 11:59 PM. Always use your most recent med list.          doxycycline 100 MG tablet Commonly known as:  VIBRA-TABS Take 1 tablet (100 mg total) by mouth 2 (two) times daily for 10 days.   FISH OIL PO Take 2 capsules by mouth every evening.   losartan 50 MG tablet Commonly known as:  COZAAR Take 1 tablet (50 mg total) by mouth daily.   multivitamin tablet Take 1 tablet by mouth daily.   RED YEAST RICE PO Take 2 capsules by mouth 2 (two) times daily.

## 2017-11-19 NOTE — Patient Instructions (Signed)
Skin Abscess A skin abscess is an infected area on or under your skin that contains pus and other material. An abscess can happen almost anywhere on your body. Some abscesses break open (rupture) on their own. Most continue to get worse unless they are treated. The infection can spread deeper into the body and into your blood, which can make you feel sick. Treatment usually involves draining the abscess. Follow these instructions at home: Abscess Care  If you have an abscess that has not drained, place a warm, clean, wet washcloth over the abscess several times a day. Do this as told by your doctor.  Follow instructions from your doctor about how to take care of your abscess. Make sure you: ? Cover the abscess with a bandage (dressing). ? Change your bandage or gauze as told by your doctor. ? Wash your hands with soap and water before you change the bandage or gauze. If you cannot use soap and water, use hand sanitizer.  Check your abscess every day for signs that the infection is getting worse. Check for: ? More redness, swelling, or pain. ? More fluid or blood. ? Warmth. ? More pus or a bad smell. Medicines   Take over-the-counter and prescription medicines only as told by your doctor.  If you were prescribed an antibiotic medicine, take it as told by your doctor. Do not stop taking the antibiotic even if you start to feel better. General instructions  To avoid spreading the infection: ? Do not share personal care items, towels, or hot tubs with others. ? Avoid making skin-to-skin contact with other people.  Keep all follow-up visits as told by your doctor. This is important. Contact a doctor if:  You have more redness, swelling, or pain around your abscess.  You have more fluid or blood coming from your abscess.  Your abscess feels warm when you touch it.  You have more pus or a bad smell coming from your abscess.  You have a fever.  Your muscles ache.  You have  chills.  You feel sick. Get help right away if:  You have very bad (severe) pain.  You see red streaks on your skin spreading away from the abscess. This information is not intended to replace advice given to you by your health care provider. Make sure you discuss any questions you have with your health care provider. Document Released: 01/24/2008 Document Revised: 04/02/2016 Document Reviewed: 06/16/2015 Elsevier Interactive Patient Education  2018 Elsevier Inc.  

## 2017-11-21 ENCOUNTER — Encounter: Payer: Self-pay | Admitting: Family Medicine

## 2017-11-21 ENCOUNTER — Ambulatory Visit (INDEPENDENT_AMBULATORY_CARE_PROVIDER_SITE_OTHER): Payer: BLUE CROSS/BLUE SHIELD | Admitting: Family Medicine

## 2017-11-21 VITALS — BP 120/74 | HR 79 | Temp 98.1°F | Ht 68.0 in | Wt 244.5 lb

## 2017-11-21 DIAGNOSIS — L02415 Cutaneous abscess of right lower limb: Secondary | ICD-10-CM

## 2017-11-21 NOTE — Progress Notes (Signed)
   Dr. Karleen HampshireSpencer T. Patsy Varma, MD, CAQ Sports Medicine Primary [facility] and Sports Medicine [address] [place], [id number] Phone: [phone] Fax: [phone]  11/21/2017  Patient: Kerry LimaJeremy Krahl, MRN: [id number], DOB: 07-May-1971, 47 y.o.  Primary Physician:  Hannah Beatopland, Kedar Sedano, MD   Chief Complaint  Patient presents with  . Remove Packing    A UNIVERSAL SURGERY CHARGE HAS BEEN APPLIED IN THE CARE OF THIS INJURY.  No co-pay should be applied in the future, and there is a 10 day follow-up period for subsequent care of this injury.   5 cc of lidocaine with epi for anesthesia and removed without difficulty.   Improved compared to Monday.  Signed,  Elpidio GaleaSpencer T. Madalyn Legner, MD

## 2017-11-22 LAB — WOUND CULTURE
MICRO NUMBER:: 90400554
SPECIMEN QUALITY:: ADEQUATE

## 2017-11-28 ENCOUNTER — Encounter: Payer: Self-pay | Admitting: Family Medicine

## 2017-11-28 MED ORDER — DOXYCYCLINE HYCLATE 100 MG PO CAPS: 100.0000 mg | ORAL_CAPSULE | Freq: Two times a day (BID) | ORAL | 0 refills | Status: DC

## 2017-11-28 NOTE — Telephone Encounter (Signed)
It may only be scar tissue / healing, but I would rather err on the side of caution.   Doxycycline 100 mg, 1 po bid, #10

## 2017-12-31 ENCOUNTER — Encounter: Payer: BLUE CROSS/BLUE SHIELD | Admitting: Family Medicine

## 2017-12-31 ENCOUNTER — Ambulatory Visit (INDEPENDENT_AMBULATORY_CARE_PROVIDER_SITE_OTHER): Payer: BLUE CROSS/BLUE SHIELD | Admitting: Family Medicine

## 2017-12-31 ENCOUNTER — Encounter: Payer: Self-pay | Admitting: Family Medicine

## 2017-12-31 ENCOUNTER — Other Ambulatory Visit: Payer: Self-pay

## 2017-12-31 VITALS — BP 118/80 | HR 76 | Temp 98.7°F | Ht 67.75 in | Wt 236.8 lb

## 2017-12-31 DIAGNOSIS — Z125 Encounter for screening for malignant neoplasm of prostate: Secondary | ICD-10-CM

## 2017-12-31 DIAGNOSIS — Z0000 Encounter for general adult medical examination without abnormal findings: Secondary | ICD-10-CM | POA: Diagnosis not present

## 2017-12-31 MED ORDER — LOSARTAN POTASSIUM 50 MG PO TABS: 50.0000 mg | ORAL_TABLET | Freq: Every day | ORAL | 3 refills | Status: DC

## 2017-12-31 NOTE — Progress Notes (Signed)
Dr. Frederico Hamman T. Copland, MD, [facility] [place], [address] Phone: [phone] Fax: [phone]  12/31/2017  Patient: Kerry Pierce, MRN: [id number], DOB: 1971/07/23, 47 y.o.  Primary Physician:  Owens Loffler, MD   Chief Complaint  Patient presents with  . Annual Exam   Subjective:   Kerry Pierce is a 47 y.o. pleasant patient who presents with the following:  Preventative Health Maintenance Visit:  Health Maintenance Summary Reviewed and updated, unless pt declines services.  Tobacco History Reviewed. Alcohol: No concerns, no excessive use Exercise Habits: Some activity, rec at least 30 mins 5 times a week STD concerns: no risk or activity to increase risk Drug Use: None Encouraged self-testicular check  Lightheaded spells the last few weeks. Momentary vertigo - some tingling.   Health Maintenance  Topic Date Due  . HIV Screening  03/16/1986  . INFLUENZA VACCINE  03/21/2018  . TETANUS/TDAP  06/25/2018   Immunization History  Administered Date(s) Administered  . Influenza,inj,Quad PF,6+ Mos 07/06/2014  . Influenza-Unspecified 05/27/2016  . Td 06/25/2008   Patient Active Problem List   Diagnosis Date Noted  . 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 Obstructive sleep apnea 11/11/2014  . Essential hypertension, benign 09/03/2014  . Solitary pulmonary nodule 05/20/2014  . 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 Hyperlipemia 06/25/2008  . COUGH VARIANT ASTHMA 06/25/2008   Past Medical History:  Diagnosis Date  . Asthma    mild; cough variant  . Embolism, pulmonary with infarction ([place]) 05/13/2014  . HLD (hyperlipidemia)   . 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 Hypertension    Patient states he takes Losartin.  . Pain in joint, lower leg    patello-femoral syndrome  . Pulmonary infarction ([facility]) 05/13/2014    Past Surgical History:  Procedure Laterality Date  . WISDOM TOOTH EXTRACTION     Social History   Socioeconomic History  . Marital status: Married    Spouse name: Not on file  . Number of children: 3  . Years of education: Not on file  . Highest education level: Not on file  Occupational History  . Occupation: Principal  Social Needs  . Financial resource strain: Not on file  . Food insecurity:    Worry: Not on file    Inability: Not on file  . Transportation needs:    Medical: Not on file    Non-medical: Not on file  Tobacco Use  . Smoking status: Never Smoker  . Smokeless tobacco: Never Used  Substance and Sexual Activity  . Alcohol use: No  . Drug use: No  . Sexual activity: Not on file  Lifestyle  . Physical activity:    Days per week: Not on file    Minutes per session: Not on file  . Stress: Not on file  Relationships  . Social connections:    Talks on phone: Not on file    Gets together: Not on file    Attends religious service: Not on file    Active member of club or organization: Not on file    Attends meetings of clubs or organizations: Not on file    Relationship status: Not on file  . Intimate partner violence:    Fear of current or ex partner: Not on file    Emotionally abused: Not on file    Physically abused: Not on file    Forced sexual  activity: Not on file  Other Topics Concern  . 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 Arthritis Unknown        family history  . Hypertension Unknown        family history  . Coronary artery disease Unknown        grandparents  . Cancer Maternal Grandmother        lung   Allergies  Allergen Reactions  . Aspirin     REACTION: hives- childhood reaction    Medication list has been reviewed and  updated.   General: Denies fever, chills, sweats. No significant weight loss. Eyes: Denies blurring,significant itching ENT: Denies earache, sore throat, and hoarseness. Cardiovascular: Denies chest pains, palpitations, dyspnea on exertion Respiratory: Denies cough, dyspnea at rest,wheeezing Breast: no concerns about lumps GI: Denies nausea, vomiting, diarrhea, constipation, change in bowel habits, abdominal pain, melena, hematochezia GU: Denies penile discharge, ED, urinary flow / outflow problems. No STD concerns. Musculoskeletal: Denies back pain, joint pain Derm: Denies rash, itching Neuro: Denies  paresthesias, frequent falls, frequent headaches Psych: Denies depression, anxiety Endocrine: Denies cold intolerance, heat intolerance, polydipsia Heme: Denies enlarged lymph nodes Allergy: No hayfever  Objective:   BP 118/80   Pulse 76   Temp 98.7 F (37.1 C) (Oral)   Ht 5' 7.75" (1.721 m)   Wt 236 lb 12 oz (107.4 kg)   BMI 36.26 kg/m  Ideal Body Weight: Weight in (lb) to have BMI = 25: 162.9  No exam data present  GEN: well developed, well nourished, no acute distress Eyes: conjunctiva and lids normal, PERRLA, EOMI ENT: TM clear, nares clear, oral exam WNL Neck: supple, no lymphadenopathy, no thyromegaly, no JVD Pulm: clear to auscultation and percussion, respiratory effort normal CV: regular rate and rhythm, S1-S2, no murmur, rub or gallop, no bruits, peripheral pulses normal and symmetric, no cyanosis, clubbing, edema or varicosities GI: soft, non-tender; no hepatosplenomegaly, masses; active bowel sounds all quadrants GU: no hernia, testicular mass, penile discharge Lymph: no cervical, axillary or inguinal adenopathy MSK: gait normal, muscle tone and strength WNL, no joint swelling, effusions, discoloration, crepitus  SKIN: clear, good turgor, color WNL, no rashes, lesions, or ulcerations Neuro: normal mental status, normal strength, sensation, and motion Psych:  alert; oriented to person, place and time, normally interactive and not anxious or depressed in appearance. All labs reviewed with patient.  Lipids:    Component Value Date/Time   CHOL 212 (H) 12/28/2016 1524   TRIG 216.0 (H) 12/28/2016 1524   HDL 39.20 12/28/2016 1524   LDLDIRECT 141.0 12/28/2016 1524   VLDL 43.2 (H) 12/28/2016 1524   CHOLHDL 5 12/28/2016 1524   CBC: CBC Latest Ref Rng & Units 12/28/2016 09/22/2015 09/18/2014  WBC 4.0 - 10.5 K/uL 10.6(H) 11.0(H) 8.9  Hemoglobin 13.0 - 17.0 g/dL 13.9 15.0 14.7  Hematocrit 39.0 - 52.0 % 40.2 45.0 44.0  Platelets 150.0 - 400.0 K/uL 298.0 315.0 875    Basic Metabolic Panel:    Component Value Date/Time   NA 139 12/28/2016 1524   NA 139 09/18/2014 1425   K 3.6 12/28/2016 1524   K 4.2 09/18/2014 1425   CL 102 12/28/2016 1524   CL 104 09/18/2014 1425  CO2 31 12/28/2016 1524   CO2 31 09/18/2014 1425   BUN 12 12/28/2016 1524   BUN 12 09/18/2014 1425   CREATININE 0.97 12/28/2016 1524   CREATININE 1.11 09/18/2014 1425   GLUCOSE 117 (H) 12/28/2016 1524   GLUCOSE 89 09/18/2014 1425   CALCIUM 9.3 12/28/2016 1524   CALCIUM 8.5 09/18/2014 1425   Hepatic Function Latest Ref Rng & Units 12/28/2016 09/22/2015 09/18/2014  Total Protein 6.0 - 8.3 g/dL 7.2 8.0 7.6  Albumin 3.5 - 5.2 g/dL 4.4 4.6 4.1  AST 0 - 37 U/L _0 ALT 0 - 53 U/L 21 26 33  Alk Phosphatase 39 - 117 U/L 66 72 73  Total Bilirubin 0.2 - 1.2 mg/dL 0.6 0.6 0.6  Bilirubin, Direct 0.0 - 0.3 mg/dL - 0.1 -    No results found for: TSH No results found for: PSA  Assessment and Plan:   Healthcare maintenance  Health Maintenance Exam: The patient's preventative maintenance and recommended screening tests for an annual wellness exam were reviewed in full today. Brought up to date unless services declined.  Counselled on the importance of diet, exercise, and its role in overall health and mortality. The patient's FH and SH was reviewed, including their home life, tobacco  status, and drug and alcohol status.  Follow-up in 1 year for physical exam or additional follow-up below.  Follow-up: No follow-ups on file. Or follow-up in 1 year if not noted.  Signed,  Maud Deed. Sharion Grieves, MD   Allergies as of 12/31/2017      Reactions   Aspirin    REACTION: hives- childhood reaction      Medication List        Accurate as of 12/31/17  3:06 PM. Always use your most recent med list.          losartan 50 MG tablet Commonly known as:  COZAAR Take 1 tablet (50 mg total) by mouth daily.   multivitamin tablet Take 1 tablet by mouth daily.   RED YEAST RICE PO Take 2 capsules by mouth 2 (two) times daily.

## 2018-01-01 LAB — CBC WITH DIFFERENTIAL/PLATELET
BASOS ABS: 0.2 10*3/uL — AB (ref 0.0–0.1)
BASOS PCT: 1.5 % (ref 0.0–3.0)
EOS ABS: 0.1 10*3/uL (ref 0.0–0.7)
Eosinophils Relative: 0.6 % (ref 0.0–5.0)
HCT: 41.5 % (ref 39.0–52.0)
Hemoglobin: 14.1 g/dL (ref 13.0–17.0)
Lymphocytes Relative: 21.3 % (ref 12.0–46.0)
Lymphs Abs: 2.5 10*3/uL (ref 0.7–4.0)
MCHC: 34.1 g/dL (ref 30.0–36.0)
MCV: 84.9 fl (ref 78.0–100.0)
MONO ABS: 1 10*3/uL (ref 0.1–1.0)
Monocytes Relative: 8.9 % (ref 3.0–12.0)
Neutro Abs: 7.8 10*3/uL — ABNORMAL HIGH (ref 1.4–7.7)
Neutrophils Relative %: 67.7 % (ref 43.0–77.0)
Platelets: 303 10*3/uL (ref 150.0–400.0)
RBC: 4.89 Mil/uL (ref 4.22–5.81)
RDW: 13.8 % (ref 11.5–15.5)
WBC: 11.6 10*3/uL — ABNORMAL HIGH (ref 4.0–10.5)

## 2018-01-01 LAB — LIPID PANEL
CHOL/HDL RATIO: 5
Cholesterol: 219 mg/dL — ABNORMAL HIGH (ref 0–200)
HDL: 41.7 mg/dL (ref >39.00)
NONHDL: 177.11
TRIGLYCERIDES: 270 mg/dL — AB (ref 0.0–149.0)
VLDL: 54 mg/dL — ABNORMAL HIGH (ref 0.0–40.0)

## 2018-01-01 LAB — BASIC METABOLIC PANEL
BUN: 15 mg/dL (ref 6–23)
CO2: 27 meq/L (ref 19–32)
CREATININE: 1.26 mg/dL (ref 0.40–1.50)
Calcium: 9.3 mg/dL (ref 8.4–10.5)
Chloride: 102 mEq/L (ref 96–112)
GFR: 65.26 mL/min (ref >60.00)
GLUCOSE: 98 mg/dL (ref 70–99)
Potassium: 4.1 mEq/L (ref 3.5–5.1)
Sodium: 139 mEq/L (ref 135–145)

## 2018-01-01 LAB — HEPATIC FUNCTION PANEL
ALK PHOS: 70 U/L (ref 39–117)
ALT: 17 U/L (ref 0–53)
AST: 18 U/L (ref 0–37)
Albumin: 4.5 g/dL (ref 3.5–5.2)
BILIRUBIN DIRECT: 0.1 mg/dL (ref 0.0–0.3)
BILIRUBIN TOTAL: 0.5 mg/dL (ref 0.2–1.2)
Total Protein: 7.4 g/dL (ref 6.0–8.3)

## 2018-01-01 LAB — LDL CHOLESTEROL, DIRECT: Direct LDL: 149 mg/dL

## 2018-01-01 LAB — PSA: PSA: 0.53 ng/mL (ref 0.10–4.00)

## 2018-07-24 ENCOUNTER — Ambulatory Visit (HOSPITAL_COMMUNITY): Payer: Self-pay | Admitting: Internal Medicine

## 2018-11-14 ENCOUNTER — Encounter: Payer: Self-pay | Admitting: Family Medicine

## 2018-11-14 MED ORDER — LOSARTAN POTASSIUM 50 MG PO TABS: 50.0000 mg | ORAL_TABLET | Freq: Every day | ORAL | 0 refills | Status: AC

## 2019-03-17 ENCOUNTER — Encounter (HOSPITAL_BASED_OUTPATIENT_CLINIC_OR_DEPARTMENT_OTHER): Payer: Self-pay | Admitting: Family Medicine

## 2019-03-17 ENCOUNTER — Ambulatory Visit: Payer: Self-pay | Attending: Family Medicine | Admitting: Family Medicine

## 2019-03-17 ENCOUNTER — Other Ambulatory Visit: Payer: Self-pay

## 2019-03-17 VITALS — BP 130/78 | HR 93 | Temp 97.7°F | Resp 18 | Ht 68.58 in | Wt 253.7 lb

## 2019-03-17 DIAGNOSIS — Z7689 Persons encountering health services in other specified circumstances: Secondary | ICD-10-CM | POA: Insufficient documentation

## 2019-03-17 DIAGNOSIS — I10 Essential (primary) hypertension: Secondary | ICD-10-CM | POA: Insufficient documentation

## 2019-03-17 DIAGNOSIS — Z79899 Other long term (current) drug therapy: Secondary | ICD-10-CM | POA: Insufficient documentation

## 2019-03-17 DIAGNOSIS — L237 Allergic contact dermatitis due to plants, except food: Secondary | ICD-10-CM | POA: Insufficient documentation

## 2019-03-17 MED ORDER — PREDNISONE 20 MG TABLET
ORAL_TABLET | ORAL | 0 refills | Status: DC
Start: 2019-03-17 — End: 2019-12-27

## 2019-03-17 MED ORDER — LOSARTAN 50 MG TABLET
50.0000 mg | ORAL_TABLET | Freq: Every day | ORAL | 3 refills | Status: DC
Start: 2019-03-17 — End: 2020-04-13

## 2019-03-17 NOTE — Progress Notes (Signed)
Department of Family Medicine   Progress Note    Alasdair Kleve  MRN: [id number]  DOB: [birth year]  Date of Service: 03/17/2019    CHIEF COMPLAINT  Chief Complaint   Patient presents with   . Establish Care   . Medication Refill   . Poison Ivy       SUBJECTIVE  Jon Murphy is a 48 y.o. male who presents to clinic for establish care.     poison ivy - this is bothersome, started days ago. OTC treatments not working.    Patient has hypertension. Patient is tolerating treatment regimen without side effects. Blood pressures at home are well controlled.       Review of Systems:  Constitutional: negative for fevers, chills  Eyes: negative for vision changes  Ears, nose, mouth, throat, and face: negative for earaches, nasal congestion, sore throat  Respiratory: negative for cough, shortness of breath  Cardiovascular: negative for chest pain, palpitations  Gastrointestinal: negative for reflux symptoms, nausea, vomiting, diarrhea, constipation  Genitourinary: negative for dysuria  Skin - positive for poison ivy  Musculoskeletal: negative for joint pain  Neurological: negative for headaches, numbness/tingling  Behavioral/Psych: negative for anxiety and depression  Endocrine: negative for excessive thirst, excessive urination    Medications:   No outpatient medications prior to visit.  No facility-administered medications prior to visit.       Allergies:   Allergies   Allergen Reactions   . Aspirin Hives/ Urticaria     Past Medical History:   Diagnosis Date   . Essential hypertension, benign      Social History     Tobacco Use   . Smoking status: Never Smoker   . Smokeless tobacco: Never Used   Substance Use Topics   . Alcohol use: Not on file   . Drug use: Not on file     Family Medical History:     Problem Relation (Age of Onset)    No Known Problems Mother, Father            OBJECTIVE  BP 130/78 (Site: Left, Patient Position: Sitting, Cuff Size: Adult Large)   Pulse 93   Temp 36.5 C (97.7 F) (Thermal Scan)   Resp 18    Ht 1.742 m (5' 8.58")   Wt 115 kg (253 lb 12 oz)   SpO2 97%   BMI 37.93 kg/m       General: no distress  HENT: TMs clear, mouth mucous membranes moist, pharynx without injection or exudate   Lungs: clear to auscultation bilaterally  Cardiovascular: RRR, no murmur  Abdomen: soft, non tender, bowel sounds present  Extremities: no cyanosis or edema  Skin: poison ivy located in groin area, arms.  Neurologic: gait is normal, AOx3, CN 2-12 grossly intact  Psychiatric: normal affect and behavior    ASSESSMENT/PLAN  (I10) Essential hypertension  (primary encounter diagnosis)  Plan: well controlled  Continue rx  Labs at next visit    (L23.7) Poison ivy  Plan: new onset  Prednisone 20 mg 9 day taper           Orders Placed This Encounter   . losartan (COZAAR) 50 mg Oral Tablet   . predniSONE (DELTASONE) 20 mg Oral Tablet         Return in about 1 year (around 03/16/2020), or if symptoms worsen or fail to improve.      Theophilus Kinds, MD 03/17/2019, 16:04

## 2019-12-14 ENCOUNTER — Other Ambulatory Visit (HOSPITAL_COMMUNITY): Payer: Self-pay

## 2019-12-14 LAB — EXTERNAL COVID-19 MOLECULAR RESULT: External 2019-n-CoV/SARS-CoV-2: POSITIVE — AB

## 2019-12-26 ENCOUNTER — Emergency Department (EMERGENCY_DEPARTMENT_HOSPITAL): Payer: Self-pay

## 2019-12-26 ENCOUNTER — Other Ambulatory Visit: Payer: Self-pay

## 2019-12-26 ENCOUNTER — Observation Stay
Admission: EM | Admit: 2019-12-26 | Discharge: 2019-12-27 | Disposition: A | Discharge status: Home / Self Care | Payer: Self-pay | Attending: Family Medicine | Admitting: Family Medicine

## 2019-12-26 ENCOUNTER — Encounter (HOSPITAL_COMMUNITY): Payer: Self-pay

## 2019-12-26 ENCOUNTER — Inpatient Hospital Stay (HOSPITAL_COMMUNITY): Payer: Self-pay | Admitting: Family Medicine

## 2019-12-26 DIAGNOSIS — U071 COVID-19: Secondary | ICD-10-CM | POA: Insufficient documentation

## 2019-12-26 DIAGNOSIS — M79605 Pain in left leg: Secondary | ICD-10-CM

## 2019-12-26 DIAGNOSIS — Z86711 Personal history of pulmonary embolism: Secondary | ICD-10-CM | POA: Insufficient documentation

## 2019-12-26 DIAGNOSIS — I272 Pulmonary hypertension, unspecified: Secondary | ICD-10-CM | POA: Insufficient documentation

## 2019-12-26 DIAGNOSIS — Z86718 Personal history of other venous thrombosis and embolism: Secondary | ICD-10-CM

## 2019-12-26 DIAGNOSIS — I2699 Other pulmonary embolism without acute cor pulmonale: Secondary | ICD-10-CM | POA: Diagnosis present

## 2019-12-26 DIAGNOSIS — R079 Chest pain, unspecified: Secondary | ICD-10-CM

## 2019-12-26 DIAGNOSIS — R0600 Dyspnea, unspecified: Secondary | ICD-10-CM

## 2019-12-26 DIAGNOSIS — I10 Essential (primary) hypertension: Secondary | ICD-10-CM | POA: Insufficient documentation

## 2019-12-26 DIAGNOSIS — Z79899 Other long term (current) drug therapy: Secondary | ICD-10-CM | POA: Insufficient documentation

## 2019-12-26 LAB — CBC WITH DIFF
BASOPHIL #: 0.11 10*3/uL (ref <=0.20)
BASOPHIL %: 1 %
EOSINOPHIL #: 0.24 10*3/uL (ref <=0.50)
EOSINOPHIL %: 2 %
HCT: 40.2 % (ref 38.9–52.0)
HGB: 14.2 g/dL (ref 13.4–17.5)
IMMATURE GRANULOCYTE #: 0.16 10*3/uL — ABNORMAL HIGH (ref <0.10)
IMMATURE GRANULOCYTE %: 1 % (ref 0–1)
LYMPHOCYTE #: 2.43 10*3/uL (ref 1.00–4.80)
LYMPHOCYTE %: 20 %
MCH: 28.8 pg (ref 26.0–32.0)
MCHC: 35.3 g/dL (ref 31.0–35.5)
MCV: 81.5 fL (ref 78.0–100.0)
MONOCYTE #: 1.24 10*3/uL — ABNORMAL HIGH (ref 0.20–1.10)
MONOCYTE %: 10 %
NEUTROPHIL #: 8.14 10*3/uL — ABNORMAL HIGH (ref 1.50–7.70)
NEUTROPHIL %: 66 %
PLATELETS: 311 10*3/uL (ref 150–400)
RBC: 4.93 10*6/uL (ref 4.50–6.10)
RDW-CV: 13.7 % (ref 11.5–15.5)
WBC: 12.3 10*3/uL — ABNORMAL HIGH (ref 3.7–11.0)

## 2019-12-26 LAB — HEPATIC FUNCTION PANEL
ALBUMIN: 3.9 g/dL (ref 3.5–5.0)
ALKALINE PHOSPHATASE: 88 U/L (ref 45–115)
ALT (SGPT): 35 U/L (ref 10–55)
AST (SGOT): 26 U/L (ref 8–45)
BILIRUBIN DIRECT: 0.1 mg/dL (ref 0.1–0.4)
BILIRUBIN TOTAL: 0.7 mg/dL (ref 0.3–1.3)
PROTEIN TOTAL: 8.1 g/dL (ref 6.4–8.3)

## 2019-12-26 LAB — PTT (PARTIAL THROMBOPLASTIN TIME): APTT: 32 seconds (ref 24.2–37.5)

## 2019-12-26 LAB — VENOUS BLOOD GAS WITH LACTATE REFLEX
%FIO2 (VENOUS): 21 %
BASE DEFICIT: 0.8 mmol/L (ref <=3.0)
BICARBONATE (VENOUS): 24 mmol/L (ref 22.0–26.0)
LACTATE: 1.1 mmol/L (ref 0.0–1.3)
PCO2 (VENOUS): 37 mm/Hg — ABNORMAL LOW (ref 41.00–51.00)
PH (VENOUS): 7.41 (ref 7.31–7.41)
PO2 (VENOUS): 49 mm/Hg (ref 35.0–50.0)

## 2019-12-26 LAB — ECG 12-LEAD
Atrial Rate: 84 {beats}/min
Calculated P Axis: 34 degrees
Calculated R Axis: -26 degrees
PR Interval: 186 ms
QRS Duration: 96 ms
QT Interval: 382 ms
QTC Calculation: 451 ms
Ventricular rate: 84 {beats}/min

## 2019-12-26 LAB — PT/INR
INR: 1.05 (ref 0.80–1.20)
PROTHROMBIN TIME: 12.1 seconds (ref 9.1–13.9)

## 2019-12-26 LAB — TROPONIN-I (FOR ED ONLY): TROPONIN I: <7 ng/L (ref 0–30)

## 2019-12-26 LAB — BASIC METABOLIC PANEL
ANION GAP: 14 mmol/L — ABNORMAL HIGH (ref 4–13)
BUN/CREA RATIO: 12 (ref 6–22)
BUN: 12 mg/dL (ref 8–25)
CALCIUM: 9.1 mg/dL (ref 8.5–10.0)
CHLORIDE: 105 mmol/L (ref 96–111)
CO2 TOTAL: 19 mmol/L — ABNORMAL LOW (ref 22–30)
CREATININE: 1.02 mg/dL (ref 0.75–1.35)
ESTIMATED GFR: 87 mL/min/BSA (ref >=60)
GLUCOSE: 100 mg/dL (ref 65–125)
POTASSIUM: 4 mmol/L (ref 3.5–5.1)
SODIUM: 138 mmol/L (ref 136–145)

## 2019-12-26 LAB — MAGNESIUM: MAGNESIUM: 1.8 mg/dL (ref 1.8–2.6)

## 2019-12-26 LAB — PHOSPHORUS: PHOSPHORUS: 3.3 mg/dL (ref 2.4–4.7)

## 2019-12-26 MED ORDER — SODIUM CHLORIDE 0.9 % (FLUSH) INJECTION SYRINGE
2.00 mL | INJECTION | INTRAMUSCULAR | Status: DC | PRN
Start: 2019-12-26 — End: 2019-12-27

## 2019-12-26 MED ORDER — IOPAMIDOL 370 MG IODINE/ML (76 %) INTRAVENOUS SOLUTION
70.00 mL | INTRAVENOUS | Status: AC
Start: 2019-12-26 — End: 2019-12-26
  Administered 2019-12-26: 22:00:00 70 mL via INTRAVENOUS

## 2019-12-26 MED ORDER — SODIUM CHLORIDE 0.9 % INJECTION SOLUTION
2.00 mL | INTRAMUSCULAR | Status: DC
Start: 2019-12-26 — End: 2019-12-27

## 2019-12-26 MED ORDER — HEPARIN (PORCINE) 25,000 UNIT/250 ML IN 0.45 % SODIUM CHLORIDE IV SOLN
17.00 [IU]/kg/h | INTRAVENOUS | Status: DC
Start: 2019-12-27 — End: 2019-12-27
  Administered 2019-12-27 (×2): 17 [IU]/kg/h via INTRAVENOUS
  Filled 2019-12-26: qty 250

## 2019-12-26 MED ORDER — HEPARIN (PORCINE) 5,000 UNITS/ML BOLUS
80.0000 [IU]/kg | Freq: Once | INTRAMUSCULAR | Status: AC
Start: 2019-12-27 — End: 2019-12-27
  Administered 2019-12-27: 7000 [IU] via INTRAVENOUS
  Filled 2019-12-26 (×2): qty 1

## 2019-12-26 MED ORDER — SODIUM CHLORIDE 0.9 % (FLUSH) INJECTION SYRINGE
2.00 mL | INJECTION | Freq: Three times a day (TID) | INTRAMUSCULAR | Status: DC
Start: 2019-12-27 — End: 2019-12-27
  Administered 2019-12-27: 06:00:00 2 mL
  Administered 2019-12-27 (×2): 0 mL

## 2019-12-26 MED ORDER — SODIUM CHLORIDE 0.9 % IV BOLUS
1000.00 mL | INJECTION | Status: AC
Start: 2019-12-26 — End: 2019-12-26
  Administered 2019-12-26: 0 mL via INTRAVENOUS
  Administered 2019-12-26: 21:00:00 1000 mL via INTRAVENOUS

## 2019-12-26 NOTE — ED Resident Handoff Note (Signed)
Emergency Department  Resident Course Note    Patient Name: Jon Murphy  Age and Gender: 49 y.o. male  Date of Birth: 21-Jan-1971  Date of Service: 12/28/2019  PCP: Vladimir Creeks, MD  Attending: Rushie Goltz, MD    After a thorough discussion of the patient including presentation, ED course, and review of above information I have assumed care of Jon Murphy from Dr. Jordan Hawks at 22:56 12/26/2019    Triage Summary:   Exposure To Coronavirus (positve since april 23rd) and Calf Pain (left back of left calf, history of PE 2015)      HPI:  In brief, patient is a 49 y.o. White male presenting with left calf pain.   COVID positive 4/23.    Hx of PE 5 years ago. Inspiration point tenderness to right side of chest.   Not on anticoagulation.   CT PE showing bilateral PEs. Started on Heparin    Pending Studies:  None    Plan:  Family Medicine admit    Course:  After assuming care of Jon Murphy, ED course included the following:   Patient did not require any further management by me while in the ED.  he remained vitally stable during my shift.   Patient admitted to the [facility] Medicine service for further management.    Clinical Impression:     Encounter Diagnoses   Name Primary?   Marland Kitchen Dyspnea    . Pulmonary HTN (CMS HCC)        Disposition: Admitted    Patient will be admitted to the [facility] Medicine service for further evaluation and management.        Follow up:   No follow-up provider specified.    Allergies:   Allergies   Allergen Reactions   . Aspirin Hives/ Urticaria       Pertinent Imaging/Lab results:  Labs Reviewed   BASIC METABOLIC PANEL - Abnormal; Notable for the following components:       Result Value    CO2 TOTAL 19 (*)     ANION GAP 14 (*)     All other components within normal limits    Narrative:     Hemolysis can alter results at this level (slight).  Lipemia can alter results at this level (mild).  Lipemia alters results at this level (mild). Repeat testing with new specimen suggested.      VENOUS BLOOD GAS WITH LACTATE REFLEX - Abnormal; Notable for the following components:    PCO2 (VENOUS) 37.00 (*)     All other components within normal limits   CBC WITH DIFF - Abnormal; Notable for the following components:    WBC 12.3 (*)     NEUTROPHIL # 8.14 (*)     MONOCYTE # 1.24 (*)     IMMATURE GRANULOCYTE # 0.16 (*)     All other components within normal limits    Narrative:     Platelet Count -Analyzer flag indicated platelet clumps- actual count may be higher.  Mean Platelet Volume - Not Reported   HEPATIC FUNCTION PANEL - Normal    Narrative:     Lipemia alters results at this level (mild). Repeat testing with new specimen suggested.  Lipemia alters results at this level (mild). Repeat testing with new specimen suggested.  Lipemia can alter results at this level (mild).  Lipemia can alter results at this level (mild).  Hemolysis can alter results at this level (slight).  Lipemia can alter results at this level (mild).  MAGNESIUM - Normal    Narrative:     Hemolysis can alter results at this level (slight).  Lipemia alters results at this level (mild). Repeat testing with new specimen suggested.   PHOSPHORUS - Normal    Narrative:     Hemolysis can alter results at this level (slight).   PT/INR - Normal    Narrative:     Coumadin therapy INR range for Conventional Anticoagulation is 2.0 to 3.0 and for Intensive Anticoagulation 2.5 to 3.5.   TROPONIN-I (FOR ED ONLY) - Normal   PTT (PARTIAL THROMBOPLASTIN TIME) - Normal    Narrative:     Therapeutic range for unfractionated heparin is 60-100 seconds.   CBC/DIFF    Narrative:     The following orders were created for panel order CBC/DIFF.  Procedure                               Abnormality         Status                     ---------                               -----------         ------                     CBC WITH CNOB[[id number]]                Abnormal            Final result                 Please view results for these tests on the individual  orders.     Results for orders placed or performed during the hospital encounter of 12/26/19   CT ANGIO CHEST FOR PULMONARY EMBOLUS W IV CONTRAST     Status: None    Narrative    CT ANGIO CHEST FOR PULMONARY EMBOLUS W IV CONTRAST performed on 12/26/2019  9:49 PM    INDICATION: 49 years old Male  hx dvt with covid    TECHNIQUE: Axial images from the thoracic inlet through the lungs obtained  after administration of IV contrast with reformatted coronal and sagittal  images, utilizing soft tissue and lung algorithms    CONTRAST: 70 cc of Isovue 370    RADIATION DOSE: 303.05 mGy.cm    COMPARISON: None available    FINDINGS:  Lungs: There are innumerable bilateral groundglass opacification of the  peripheral predominance in patient with known covid 19 infection. This  decreases sensitivity for evaluation of nodules/masses. There is no   pleural  effusion, or pneumothorax. There are a few bilateral parasagittal nodes   the  largest seen along the right minor fissure. The central airways are clear.    Heart/Mediastinum: The heart is normal in size. The aorta and pulmonary  artery normal in size. There are bilateral moderate-sized filling defects  within the main pulmonary arteries, lobar and segmental pulmonary arteries  consistent with pulmonary emboli. For example on series 3 image 63 there  are filling defects within the bilateral main pulmonary arteries. No  evidence of right heart strain on CT. There is a mild degree of  atherosclerotic disease. Esophagus is decompressed. The thyroid is  homogenous.    Abdomen: Included upper abdomen demonstrates  no evidence of acute  abnormality.    Soft Tissues/Bones: There are mild multilevel degenerative changes. The  soft tissues are unremarkable.      Impression    1.  Moderate size bilateral main, lobar and segmental pulmonary emboli  without evidence of right heart strain.  2.  Bilateral scattered consolidations with peripheral predominance in  patient with known recent covid 19  infection.    Critical findings discussed with Dr. Jordan Hawks via phone by Dr.   Stevphen Meuse during dictation at approximately 10:15 PM on Dec 26, 2019.         /Shamond Skelton "Geannie Risen, MD 12/26/2019, 22:56   PGY-2 Emergency Medicine  [facility]  Pager # - [employer]    *Parts of this patients chart were completed in a retrospective fashion due to simultaneous direct patient care activities in the Emergency Department.   *This note was partially generated using MModal Fluency Direct system, and there may be some incorrect words, spellings, and punctuation that were not noted in checking the note before saving.

## 2019-12-26 NOTE — ED Attending Handoff Note (Signed)
Care/report received from Dr. Gaylan Gerold at  22:56.  Per report:  Covid positive, came in with left calf pain, concern for hx of PE in past  CT PE positive, on heparing  Pending labs/imaging: FM admission  Course:  admitted in stable condition  ED Course as of Dec 26 1649   Fri Dec 26, 2019   2100 Negative DVT study     [MJ]      ED Course User Index  [MJ] Eudelia Bunch, MD       Dispostion: Admitted  Clinical Impression:   Encounter Diagnoses   Name Primary?    Dyspnea     Pulmonary HTN (CMS HCC)

## 2019-12-26 NOTE — ED Nurses Note (Signed)
SBAR report given to [employer].

## 2019-12-26 NOTE — ED Nurses Note (Signed)
Patient sleeping on ED stretcher. Daughter is at bedside.     Call light within reach. Will continue to monitor.

## 2019-12-26 NOTE — ED Provider Notes (Signed)
[facility]  Emergency Department  Provider Note    Name: Jon Murphy  Age and Gender: 49 y.o. male  Date of Birth: 1971-01-24  Date of Service: 12/26/2019  MRN: [id number]  PCP: Vladimir Creeks, MD  Attending: Dr. Gaylan Gerold    Chief Complaint   Patient presents with   . Exposure To Coronavirus     positve since april 23rd   . Calf Pain     left back of left calf, history of PE 2015       Clinical Impression:     Encounter Diagnoses   Name Primary?   Marland Kitchen Dyspnea    . Pulmonary HTN (CMS HCC)        MDM/Course:  Jon Murphy is a 49 y.o. male who presented with left calf pain.     On initial exam patient is sitting comfortably in exam bed in no acute distress blood pressure 114/63, heart rate 70 patient is satting 100% on room air and is afebrile   Physical exam reveals bilateral breath sounds, tenderness to his left calf, regular rate and rhythm no murmurs rubs or gallops   DVT study at the bedside was performed and negative, echo unremarkable for any acute right heart strain   Patient does have a pretty significant history of PE was on anticoagulation for 6 months however was discontinued, with his current diagnosis of COVID as well as chest pain, will obtain as CT PE   Significant clot burden identified on CT, started on heparin drip   Medicine was consulted for admission for COVID induced thrombosis    ED Course as of Dec 30 324   Fri Dec 26, 2019   2100 Negative DVT study     [MJ]      ED Course User Index  [MJ] Eudelia Bunch, MD       HPI:  Jon Murphy is a 49 y.o. White male presenting with left calf pain.  Patient tested positive for COVID-19 on April 23rd, 2021.  The patient quarantined for his 10 days but his cough has still continued.  For the past 4 days, he has had pain in his left calf.  He also complains of point tenderness in his left chest with inspiration.  Patient as a history of an unprovoked PE in 2015 and was put anticoagulants for six months following his diagnosis.   Patient has not been taking anticoagulation medicines for that past several years.  The patient checked his oxygen saturation at home and it was 92% which prompted him to come to the ED.  Patient is concerned that he has another blood clot.  No other PMHx reported.  Denies fever and any other symptoms at this time.     Medications given:  Medications   NS bolus infusion 1,000 mL (0 mL Intravenous Stopped 12/26/19 2128)   iopamidol (ISOVUE-370) 76% infusion (70 mL Intravenous Given 12/26/19 2138)   heparin 5,000 units/mL initial IV BOLUS (has no administration in time range)       Care of Jon Murphy was checked out to Dr. Farrel Demark at 10:57 PM following a discussion of the patient's course.     Disposition: Admitted    Discharge Medication List as of 12/27/2019  2:38 PM      START taking these medications    Details   apixaban (ELIQUIS) 5 mg Oral Tablet Please take 2 tabs (10 mg total) in the morning and in the evening for 7 days. Then begin 1 tab (  5 mg) in the morning and evening there on after., Disp-74 Tablet, R-0, E-Rx             Follow up:    No follow-up provider specified.  -------------------------------------------------------------------------------------------------------------------------------------------    Arrival: The patient arrived by private car and is alone.  History Obtained by: provider  History Limitations: none    ROS:   Constitutional: No fever, chills weight loss , night sweats or weakness   Skin: No rash, lesions, or diaphoresis  HENT: No headaches, nasal or ocular discharge,  or congestion  Eyes: No vision changes or photophobia   Cardio: No palpitations, dyspnea on exertion or leg swelling +point tenderness in left chest  Respiratory: No wheezing, hemoptysis or SOB +cough  GI:  No nausea, vomiting, diarrhea, or stool changes. No changes in appetite or new abdominal pain.   GU:  No dysuria, hematuria, or increased frequency  MSK: No muscle aches, joint or back pain +left calf pain  Neuro: No  seizures, LOC, numbness, tingling, or focal weakness  All other systems reviewed and are negative.      Below pertinent information reviewed with patient:  Past Medical History:   Diagnosis Date   . Essential hypertension, benign        Medications Prior to Admission     Prescriptions    losartan (COZAAR) 50 mg Oral Tablet    Take 1 Tab (50 mg total) by mouth Once a day    predniSONE (DELTASONE) 20 mg Oral Tablet    Take 2 po x 3 days, then 1 tab po x 3 days, then 1/2 tab po x 3 days          Allergies   Allergen Reactions   . Aspirin Hives/ Urticaria       History reviewed. No pertinent surgical history.    Family Medical History:     Problem Relation (Age of Onset)    Cancer Mother    Coronary Artery Disease Father    Diabetes Mother    Healthy Son, Son, Son          Social History     Socioeconomic History   . Marital status: Married     Spouse name: Not on file   . Number of children: Not on file   . Years of education: Not on file   . Highest education level: Not on file   Occupational History   . Not on file   Tobacco Use   . Smoking status: Never Smoker   . Smokeless tobacco: Never Used   Vaping Use   . Vaping Use: Never used   Substance and Sexual Activity   . Alcohol use: Not Currently   . Drug use: Never   . Sexual activity: Not on file   Other Topics Concern   . Not on file   Social History Narrative   . Not on file     Social Determinants of Health     Financial Resource Strain:    . Difficulty of Paying Living Expenses:    Food Insecurity:    . Worried About Programme researcher, broadcasting/film/video in the Last Year:    . Barista in the Last Year:    Transportation Needs:    . Freight forwarder (Medical):    Marland Kitchen Lack of Transportation (Non-Medical):    Physical Activity:    . Days of Exercise per Week:    . Minutes of Exercise per Session:  Stress:    . Feeling of Stress :    Intimate Partner Violence:    . Fear of Current or Ex-Partner:    . Emotionally Abused:    Marland Kitchen. Physically Abused:    . Sexually Abused:           Objective:  ED Triage Vitals   BP (Non-Invasive) 12/26/19 2002 (!) 144/89   Heart Rate 12/26/19 2002 93   Respiratory Rate 12/26/19 2002 (!) 29   Temperature 12/26/19 2014 37.1 C (98.8 F)   SpO2 12/26/19 2002 96 %   Weight 12/26/19 2014 115 kg (253 lb 8.5 oz)   Height 12/26/19 2014 1.742 m (5' 8.58")       Nursing notes and vital signs reviewed.      Constitutional: Pleasant 49 y.o. male sitting comfortably in exam bed, appears stated age in fair health, in no acute distress, normal color, no cyanosis.   HENT:    Head: Normocephalic and atraumatic.    Mouth/Throat: Oropharynx is clear and moist.   Eyes: EOMI, PERRLA, no conjunctival injections    Neck: Trachea midline. Neck supple.  Cardiovascular: RRR, No murmurs, rubs or gallops. Intact distal pulses, normal JVP  Pulmonary/Chest: Breath sounds equal bilaterally, no wheezes rales or chest tenderness.  No respiratory distress. Equal inspiratory/expiratory phases with equal bilateral chest rise and fall.   Abdominal: BS +. Abdomen soft, no tenderness, rebound or guarding.  Back: No midline spinal tenderness, no paraspinal tenderness, no CVA tenderness.           Musculoskeletal: No edema or deformity.  Left calf TTP  Skin: Warm and dry. No rash, erythema, pallor or cyanosis or edema.   Psychiatric: normal mood and affect. Behavior is appropriate.   Neurological: Patient is alert and responsive, CN II-XII grossly intact, moving all extremities equally and fully, normal gait. Distal sensation grossly intact bilaterally upper and lower extremities.     Labs:   Labs Reviewed   BASIC METABOLIC PANEL - Abnormal; Notable for the following components:       Result Value    CO2 TOTAL 19 (*)     ANION GAP 14 (*)     All other components within normal limits    Narrative:     Hemolysis can alter results at this level (slight).  Lipemia can alter results at this level (mild).  Lipemia alters results at this level (mild). Repeat testing with new specimen suggested.   VENOUS  BLOOD GAS WITH LACTATE REFLEX - Abnormal; Notable for the following components:    PCO2 (VENOUS) 37.00 (*)     All other components within normal limits   CBC WITH DIFF - Abnormal; Notable for the following components:    WBC 12.3 (*)     NEUTROPHIL # 8.14 (*)     MONOCYTE # 1.24 (*)     IMMATURE GRANULOCYTE # 0.16 (*)     All other components within normal limits    Narrative:     Platelet Count -Analyzer flag indicated platelet clumps- actual count may be higher.  Mean Platelet Volume - Not Reported   HEPATIC FUNCTION PANEL - Normal    Narrative:     Lipemia alters results at this level (mild). Repeat testing with new specimen suggested.  Lipemia alters results at this level (mild). Repeat testing with new specimen suggested.  Lipemia can alter results at this level (mild).  Lipemia can alter results at this level (mild).  Hemolysis can alter results at this level (  slight).  Lipemia can alter results at this level (mild).   MAGNESIUM - Normal    Narrative:     Hemolysis can alter results at this level (slight).  Lipemia alters results at this level (mild). Repeat testing with new specimen suggested.   PHOSPHORUS - Normal    Narrative:     Hemolysis can alter results at this level (slight).   PT/INR - Normal    Narrative:     Coumadin therapy INR range for Conventional Anticoagulation is 2.0 to 3.0 and for Intensive Anticoagulation 2.5 to 3.5.   TROPONIN-I (FOR ED ONLY) - Normal   PTT (PARTIAL THROMBOPLASTIN TIME) - Normal    Narrative:     Therapeutic range for unfractionated heparin is 60-100 seconds.   CBC/DIFF    Narrative:     The following orders were created for panel order CBC/DIFF.  Procedure                               Abnormality         Status                     ---------                               -----------         ------                     CBC WITH TIWP[[id number]]                Abnormal            Final result                 Please view results for these tests on the individual orders.        Imaging:  CT ANGIO CHEST FOR PULMONARY EMBOLUS W IV CONTRAST   Final Result   1.  Moderate size bilateral main, lobar and segmental pulmonary emboli   without evidence of right heart strain.   2.  Bilateral scattered consolidations with peripheral predominance in   patient with known recent covid 19 infection.      Critical findings discussed with Dr. Halina Andreas via phone by Dr.    Sherwood Gambler during dictation at approximately 10:15 PM on Dec 26, 2019.         ED LIMITED LOWER EXT DVT ULTRASOUND    (Results Pending)       EKG: NSR    Patient seen by and discussed with attending physician, Dr. Aggie Hacker.    Parts of this patients chart were completed in a retrospective fashion due to simultaneous direct patient care activities in the Emergency Department.   This note was partially generated using MModal Fluency Direct system, and there may be some incorrect words, spellings, and punctuation that were not noted in checking the note before saving.      I am scribing for, and in the presence of, Dr. Wynetta Emery for services provided on 12/26/2019  Patrick North, SCRIBE    // Patrick North, [place]  12/26/2019, 21:07    I personally performed the services described in this documentation, as scribed  in my presence, and it is both accurate  and complete.    Morley Kos, MD        Lilia Pro Jerilynn Mages.  Laural Benes, MD 12/31/2019 03:25  PGY-3 Emergency Medicine  [facility]  Pager # - [facility]

## 2019-12-26 NOTE — H&P (Signed)
Department of Family Medicine  Inpatient History and Physical    Jon Murphy  MRN: [id number]  Date of Birth:  Jun 06, 1971    PCP: Jon Creeks, MD     Date of Admission: 12/27/2019    CHIEF COMPLAINT:  Back Pain and Calf Pain    HISTORY OF PRESENT ILLNESS   Jon Murphy is a 49 y.o. male with past medical history HTN, hx of unprovoked PE in 2015 and recent COVID-19 infection (Positive on 12/14/2019) who presents with 3 days of left calf pain. States that pain is worse with walking and flexing his heal. No increased swelling of the leg nor was it warm or erythematous. Patient states he originally thought he had a high ankle sprain but had not traumatized it. Patient states that today while at home he developed back pain. He tried to massage it out but it would not subsided and he just had mild shortness of breathe. Due to his known COVID infection he came to the ED because he had experienced the same symptoms with his past PE.    In the ED, HR 93, BP 144/89 and RR 29 and O2 sats in high 90's on RA. WBC 12.3, BMP, HFP, Troponin, VBG, and EKG unremarkable. ED [place] Lower extremity US was negative CTPE was   Remarkable for moderate size bilateral main, lobar and segmental pulmonary emboli without evidence of right heart strain. Heparin was initiated and DFM was called for admission.      --------------------------------------------------------------------------------------  Past Medical History:   Diagnosis Date    Essential hypertension, benign          History reviewed. No past surgical history pertinent negatives.      Family Medical History:       Problem Relation (Age of Onset)    No Known Problems Mother, Father              Social History     Tobacco Use    Smoking status: Never Smoker    Smokeless tobacco: Never Used   Vaping Use    Vaping Use: Never used   Substance Use Topics    Alcohol use: Not Currently    Drug use: Never      Medications Prior to Admission       Prescriptions    losartan (COZAAR) 50 mg Oral  Tablet    Take 1 Tab (50 mg total) by mouth Once a day    predniSONE (DELTASONE) 20 mg Oral Tablet    Take 2 po x 3 days, then 1 tab po x 3 days, then 1/2 tab po x 3 days           Allergies   Allergen Reactions    Aspirin Hives/ Urticaria        --------------------------------------------------------------------------------------  REVIEW OF SYSTEMS: As discussed above in the HPI; otherwise all other systems negative.    PHYSICAL EXAM  Temperature: 37.1 C (98.8 F)  Heart Rate: 85  BP (Non-Invasive): (!) 112/99  Respiratory Rate: (!) 24  SpO2: 96 %  General: appears in good health and no distress  Eyes: Pupils equal and round, reactive to light and accomodation.   HENT:ENMT without erythema or injection, mucous membranes moist.  Neck: no thyromegaly or lymphadenopathy  Lungs: Clear to auscultation bilaterally.   Cardiovascular: regular rate and rhythm, S1, S2 normal, no murmur, click, rub or gallop  Abdomen: Soft, non-tender  Extremities:  DP present and equal bilatearlly. No cyanosis, edema or erythema noted bilatearlly.  Tenderness to palpation of left calf. Homen's sign positive on left lower extremity  Skin: Skin warm and dry  Neurologic: Grossly normal  Lymphatics: No lymphadenopathy  Psychiatric: Normal affect, behavior, memory, thought content, judgement, and speech.    LABS  I have reviewed all lab results.    RADIOLOGY RESULTS   Results for orders placed or performed during the hospital encounter of 12/26/19 (from the past 72 hour(s))   CT ANGIO CHEST FOR PULMONARY EMBOLUS W IV CONTRAST     Status: None (Preliminary result)    Narrative    CT ANGIO CHEST FOR PULMONARY EMBOLUS W IV CONTRAST performed on 12/26/2019  9:49 PM    INDICATION: 49 years old Male  hx dvt with covid    TECHNIQUE: Axial images from the thoracic inlet through the lungs obtained  after administration of IV contrast with reformatted coronal and sagittal  images, utilizing soft tissue and lung algorithms    CONTRAST: 70 cc of Isovue 370     RADIATION DOSE: 303.05 mGy.cm    COMPARISON: None available    FINDINGS:  Lungs: There are innumerable bilateral groundglass opacification of the  peripheral predominance in patient with known covid 19 infection. This  decreases sensitivity for evaluation of nodules/masses. There is no pleural  effusion, or pneumothorax. There are a few bilateral parasagittal nodes the  largest seen along the right minor fissure. The central airways are clear.    Heart/Mediastinum: The heart is normal in size. The aorta and pulmonary  artery normal in size. There are bilateral moderate-sized filling defects  within the main pulmonary arteries, lobar and segmental pulmonary arteries  consistent with pulmonary emboli. For example on series 3 image 63 there  are filling defects within the bilateral main pulmonary arteries. No  evidence of right heart strain on CT. There is a mild degree of  atherosclerotic disease. Esophagus is decompressed. The thyroid is  homogenous.    Abdomen: Included upper abdomen demonstrates no evidence of acute  abnormality.    Soft Tissues/Bones: There are mild multilevel degenerative changes. The  soft tissues are unremarkable.      Impression    1.  Moderate size bilateral main, lobar and segmental pulmonary emboli  without evidence of right heart strain.  2.  Bilateral scattered consolidations with peripheral predominance in  patient with known recent covid 19 infection.    Critical findings discussed with Dr. Halina Andreas via phone by Dr. Sherwood Gambler during dictation at approximately 10:15 PM on Dec 26, 2019.         MICROBIOLOGY  No results found for any visits on 12/26/19 (from the past 96 hour(s)).     DNR STATUS: Full Code    ASSESSMENT/PLAN:  49 y.o. male with PMH of HTN, hx of unprovoked PE in 2015 and recent COVID-19 infection (Positive on 12/14/2019) who presents with 3 days of left calf pain.     Bilateral Main, Lobar and Segmental Pulmonary Embolic  Cough and congestion started on 12/13/2019   COVID-19 Positive on 12/14/2019, quarantined but without any therapeutic intervention  Left Calf Pain for past 3 days with acute onset of back pain and SOB today  Tachypneic in ED maintaining oxygen saturations of 90's on RA  Heparin-Thrombotic Protocol initiated  Plan  - Admit to DFM Service of Dr. Iva Boop Floor Vitals q 4 hours Pulse Ox q 4 hours  - Continue Heparin- Thrombotic Protocol  - TTE ordered to assess for right heart strain  - LE venous  duplex not ordered as it will not change management  - Discuss DOAC versus Warfarin tomorrow and likely transition  - Will need at least 3 months therapy and will plan to discuss any further duration with PCP  - Patient previously chose warfarin due to cost     Chronic Medical Conditions  HTN: Continue Cozaar 50 mg daily    Nutrition: Regular Diet  Fluids: PO  GI prophylaxis: N/I  DVT/PE: heparin    PT/OT: NI  Disposition: Home       Meriam Sprague, MD   22:33;12/26/2019       Late entry for 12/27/19. I saw and examined the patient.  I reviewed the resident's note.  I agree with the findings and plan of care as documented in the resident's note.  Any exceptions/additions are edited/noted.    Dagoberto Ligas, DO

## 2019-12-26 NOTE — ED Nurses Note (Signed)
Patient to CT.

## 2019-12-26 NOTE — ED Nurses Note (Signed)
Patient presented to the ED with c/o left calf pain.    Patient states  Having history of PE. Patient is covid positive.      Patient report pain in back, sob, calf, reports spo2 at home of 91%      ED MD  Jordan Hawks at bedside for evaluation.    PIV placed, blood specimens and urine collected and sent ot the lab.  Patient updated on plan of care. Patient verbalized understanding.  Call light within reach. Will continue to monitor.

## 2019-12-26 NOTE — Nurses Notes (Signed)
Family Medicine at bedside to evaluate and admit the Pt.

## 2019-12-27 ENCOUNTER — Encounter (HOSPITAL_COMMUNITY): Payer: Self-pay | Admitting: Family Medicine

## 2019-12-27 ENCOUNTER — Inpatient Hospital Stay (HOSPITAL_COMMUNITY): Payer: Self-pay

## 2019-12-27 DIAGNOSIS — I2694 Multiple subsegmental pulmonary emboli without acute cor pulmonale: Secondary | ICD-10-CM

## 2019-12-27 DIAGNOSIS — I272 Pulmonary hypertension, unspecified: Secondary | ICD-10-CM

## 2019-12-27 DIAGNOSIS — I361 Nonrheumatic tricuspid (valve) insufficiency: Secondary | ICD-10-CM

## 2019-12-27 DIAGNOSIS — U071 COVID-19: Secondary | ICD-10-CM

## 2019-12-27 DIAGNOSIS — I10 Essential (primary) hypertension: Secondary | ICD-10-CM

## 2019-12-27 LAB — BASIC METABOLIC PANEL
ANION GAP: 10 mmol/L (ref 4–13)
BUN/CREA RATIO: 13 (ref 6–22)
BUN: 11 mg/dL (ref 8–25)
CALCIUM: 8.3 mg/dL — ABNORMAL LOW (ref 8.5–10.0)
CHLORIDE: 105 mmol/L (ref 96–111)
CO2 TOTAL: 23 mmol/L (ref 22–30)
CREATININE: 0.86 mg/dL (ref 0.75–1.35)
ESTIMATED GFR: >90 mL/min/BSA (ref >=60)
GLUCOSE: 97 mg/dL (ref 65–125)
POTASSIUM: 4.1 mmol/L (ref 3.5–5.1)
SODIUM: 138 mmol/L (ref 136–145)

## 2019-12-27 LAB — CBC WITH DIFF
BASOPHIL #: 0.1 10*3/uL (ref <=0.20)
BASOPHIL %: 1 %
EOSINOPHIL #: 0.37 10*3/uL (ref <=0.50)
EOSINOPHIL %: 3 %
HCT: 37.5 % — ABNORMAL LOW (ref 38.9–52.0)
HGB: 13 g/dL — ABNORMAL LOW (ref 13.4–17.5)
IMMATURE GRANULOCYTE #: 0.1 10*3/uL — ABNORMAL HIGH (ref <0.10)
IMMATURE GRANULOCYTE %: 1 % (ref 0–1)
LYMPHOCYTE #: 2.44 10*3/uL (ref 1.00–4.80)
LYMPHOCYTE %: 23 %
MCH: 28.2 pg (ref 26.0–32.0)
MCHC: 34.7 g/dL (ref 31.0–35.5)
MCV: 81.3 fL (ref 78.0–100.0)
MONOCYTE #: 1.2 10*3/uL — ABNORMAL HIGH (ref 0.20–1.10)
MONOCYTE %: 11 %
MPV: 10.4 fL (ref 8.7–12.5)
NEUTROPHIL #: 6.56 10*3/uL (ref 1.50–7.70)
NEUTROPHIL %: 61 %
PLATELETS: 269 10*3/uL (ref 150–400)
RBC: 4.61 10*6/uL (ref 4.50–6.10)
RDW-CV: 13.8 % (ref 11.5–15.5)
WBC: 10.8 10*3/uL (ref 3.7–11.0)

## 2019-12-27 LAB — MAGNESIUM: MAGNESIUM: 1.9 mg/dL (ref 1.8–2.6)

## 2019-12-27 LAB — PTT (PARTIAL THROMBOPLASTIN TIME)
APTT: 66.3 seconds — ABNORMAL HIGH (ref 24.2–37.5)
APTT: 61.6 seconds — ABNORMAL HIGH (ref 24.2–37.5)

## 2019-12-27 LAB — PHOSPHORUS: PHOSPHORUS: 3.3 mg/dL (ref 2.4–4.7)

## 2019-12-27 MED ORDER — APIXABAN 5 MG TABLET
ORAL_TABLET | ORAL | 0 refills | Status: DC
Start: 2019-12-27 — End: 2020-11-15

## 2019-12-27 MED ORDER — ENOXAPARIN 120 MG/0.8 ML SUBCUTANEOUS SYRINGE
1.00 mg/kg | INJECTION | Freq: Two times a day (BID) | SUBCUTANEOUS | 1 refills | Status: DC
Start: 2019-12-27 — End: 2019-12-27

## 2019-12-27 MED ORDER — APIXABAN 5 MG TABLET
10.00 mg | ORAL_TABLET | Freq: Two times a day (BID) | ORAL | 0 refills | Status: DC
Start: 2019-12-27 — End: 2019-12-27

## 2019-12-27 MED ORDER — LOSARTAN 50 MG TABLET
50.00 mg | ORAL_TABLET | Freq: Every day | ORAL | Status: DC
Start: 2019-12-27 — End: 2019-12-27
  Administered 2019-12-27: 08:00:00 50 mg via ORAL
  Filled 2019-12-27: qty 1

## 2019-12-27 NOTE — Nurses Notes (Signed)
Results for HARRINGTON, JOBE (MRN [id number]) as of 12/27/2019 06:30   Ref. Range 12/27/2019 05:58   aPTT Latest Ref Range: 24.2 - 37.5 seconds 66.3 (H)   Per protocol no change next PTT in 6 hours.

## 2019-12-27 NOTE — Nurses Notes (Signed)
Results for Jon Murphy, Jon Murphy (MRN [id number]) as of 12/27/2019 12:13   Ref. Range 12/27/2019 11:31   aPTT Latest Ref Range: 24.2 - 37.5 seconds 61.6 (H)   Per protocol no change needed.

## 2019-12-27 NOTE — Nurses Notes (Signed)
Verbal report called to [facility] RN. Pt to be transported by RN when the room is clean.

## 2019-12-27 NOTE — Progress Notes (Signed)
Department of Family Medicine  Inpatient Daily Progress Note     Jon, Murphy 49 y.o. male  Date of Birth:  [birth year]  Date of Admission:  12/26/2019      Date of service: 12/27/2019  CC: Pulmonary embolism (CMS HCC)    SUBJECTIVE  Overnight: NAEO once settled.    This morning: States that he is having some mild chest discomfort but it is improving. No SOB noted. Patient endorses not have medication insurance coverage so figuring out an option for anticoagulation that is affordable is important to him    Denies chest pain, palpitations, dyspnea, fevers, chills.  Appetite is normal.     ROS: As discussed above in the HPI; otherwise all other systems negative.    Current Medications: losartan (COZAAR) tablet 50 mg Daily    INSERT & MAINTAIN PERIPHERAL IV ACCESS  UNTIL DISCONTINUED **AND** PERIPHERAL IV DRESSING CHANGE  Q 7 DAYS **AND** NS flush syringe 2-6 mL Q8HRS **AND** NS flush syringe 2-6 mL Q1 MIN PRN    perflutren lipid microspheres (DEFINITY) 1.3 mL in NS 10 mL (tot vol) injection 2 mL Give in Cardiology     OBJECTIVE  Vital Signs:  Patient Vitals for the past 24 hrs:   BP Temp Pulse Resp SpO2 Height Weight   12/27/19 0453 117/72 36.9 C (98.4 F) 65 16 94 %     12/27/19 0154 128/81 36.8 C (98.2 F) 68 16 95 % 1.753 m (5\' 9" ) 110 kg (242 lb 1 oz)   12/26/19 2300 133/83  81 15 94 %     12/26/19 2203     96 %     12/26/19 2202 (!) 112/99         12/26/19 2100 137/83 36.5 C (97.7 F) 85 (!) 24 96 %     12/26/19 2030 (!) 155/91  89  94 %     12/26/19 2014 (!) 144/89 37.1 C (98.8 F) 94 (!) 21 97 % 1.742 m (5' 8.58") 115 kg (253 lb 8.5 oz)   12/26/19 2002 (!) 144/89  93 (!) 29 96 %       I/O:  Date 12/26/19 0700 - 12/27/19 0659 12/27/19 0700 - 12/28/19 0659   Shift 0700-1459 1500-2259 2300-0659 24 Hour Total 0700-1459 1500-2259 2300-0659 24 Hour Total   INTAKE   P.O.   120 120         Oral   120 120       I.V.  1000(1.09)  1000         Volume Infused (NS bolus infusion 1,000 mL)  1000  1000       Shift  Total(mL/kg)  1000(8.7) 120(1.09) 1120(10.2)       OUTPUT   Urine             Urine Occurrence   0 x 0 x       Stool             Stool Occurrence   0 x 0 x       Shift Total(mL/kg)           Weight (kg)  115 109.8 109.8 109.8 109.8 109.8 109.8     BM: Last Bowel Movement: 12/26/19    Physical Exam:  General: appears in good health and no distress  Eyes: Conjunctiva clear.  HENT:ENMT without erythema or injection, mucous membranes moist.  Neck: no thyromegaly or lymphadenopathy  Lungs: Clear to auscultation bilaterally.   Cardiovascular: regular rate and rhythm  Abdomen: Soft, non-tender, Bowel sounds normal, non-distended  Extremities: No cyanosis or edema  Skin: Skin warm and dry  Neurologic: CN II - XII grossly intact , Alert and oriented x3  Lymphatics: No lymphadenopathy  Psychiatric: Normal affect, behavior, memory, thought content, judgement, and speech.    Labs: I have personally reviewed laboratory values.  Results for orders placed or performed during the hospital encounter of 12/26/19 (from the past 24 hour(s))   BASIC METABOLIC PANEL   Result Value Ref Range    SODIUM 138 136 - 145 mmol/L    POTASSIUM 4.0 3.5 - 5.1 mmol/L    CHLORIDE 105 96 - 111 mmol/L    CO2 TOTAL 19 (L) 22 - 30 mmol/L    ANION GAP 14 (H) 4 - 13 mmol/L    CALCIUM 9.1 8.5 - 10.0 mg/dL    GLUCOSE 630 65 - 160 mg/dL    BUN 12 8 - 25 mg/dL    CREATININE 1.09 3.23 - 1.35 mg/dL    BUN/CREA RATIO 12 6 - 22    ESTIMATED GFR 87 >=60 mL/min/BSA    Narrative    Hemolysis can alter results at this level (slight).  Lipemia can alter results at this level (mild).  Lipemia alters results at this level (mild). Repeat testing with new specimen suggested.   CBC/DIFF    Narrative    The following orders were created for panel order CBC/DIFF.  Procedure                               Abnormality         Status                     ---------                               -----------         ------                     CBC WITH FTDD[[id number]]                 Abnormal            Final result                 Please view results for these tests on the individual orders.   HEPATIC FUNCTION PANEL   Result Value Ref Range    ALBUMIN 3.9 3.5 - 5.0 g/dL     ALKALINE PHOSPHATASE 88 45 - 115 U/L    ALT (SGPT) 35 10 - 55 U/L    AST (SGOT)  26 8 - 45 U/L    BILIRUBIN TOTAL 0.7 0.3 - 1.3 mg/dL    BILIRUBIN DIRECT 0.1 0.1 - 0.4 mg/dL    PROTEIN TOTAL 8.1 6.4 - 8.3 g/dL    Narrative    Lipemia alters results at this level (mild). Repeat testing with new specimen suggested.  Lipemia alters results at this level (mild). Repeat testing with new specimen suggested.  Lipemia can alter results at this level (mild).  Lipemia can alter results at this level (mild).  Hemolysis can alter results at this level (slight).  Lipemia can alter results at this level (mild).   MAGNESIUM   Result Value Ref Range    MAGNESIUM 1.8 1.8 - 2.6 mg/dL  Narrative    Hemolysis can alter results at this level (slight).  Lipemia alters results at this level (mild). Repeat testing with new specimen suggested.   PHOSPHORUS   Result Value Ref Range    PHOSPHORUS 3.3 2.4 - 4.7 mg/dL    Narrative    Hemolysis can alter results at this level (slight).   PT/INR   Result Value Ref Range    PROTHROMBIN TIME 12.1 9.1 - 13.9 seconds    INR 1.05 0.80 - 1.20    Narrative    Coumadin therapy INR range for Conventional Anticoagulation is 2.0 to 3.0 and for Intensive Anticoagulation 2.5 to 3.5.   TROPONIN-I (FOR ED ONLY)   Result Value Ref Range    TROPONIN I <7 0 - 30 ng/L   VENOUS BLOOD GAS WITH LACTATE REFLEX   Result Value Ref Range    %FIO2 (VENOUS) 21.0 %    PH (VENOUS) 7.41 7.31 - 7.41    PCO2 (VENOUS) 37.00 (L) 41.00 - 51.00 mm/Hg    PO2 (VENOUS) 49.0 35.0 - 50.0 mm/Hg    BASE DEFICIT 0.8 -3.0 - 3.0 mmol/L    BICARBONATE (VENOUS) 24.0 22.0 - 26.0 mmol/L    LACTATE 1.1 0.0 - 1.3 mmol/L   CBC WITH DIFF   Result Value Ref Range    WBC 12.3 (H) 3.7 - 11.0 x10^3/uL    RBC 4.93 4.50 - 6.10 x10^6/uL    HGB 14.2 13.4 - 17.5  g/dL    HCT 14.4 31.5 - 40.0 %    MCV 81.5 78.0 - 100.0 fL    MCH 28.8 26.0 - 32.0 pg    MCHC 35.3 31.0 - 35.5 g/dL    RDW-CV 86.7 61.9 - 50.9 %    PLATELETS 311 150 - 400 x10^3/uL    NEUTROPHIL % 66 %    LYMPHOCYTE % 20 %    MONOCYTE % 10 %    EOSINOPHIL % 2 %    BASOPHIL % 1 %    NEUTROPHIL # 8.14 (H) 1.50 - 7.70 x10^3/uL    LYMPHOCYTE # 2.43 1.00 - 4.80 x10^3/uL    MONOCYTE # 1.24 (H) 0.20 - 1.10 x10^3/uL    EOSINOPHIL # 0.24 <=0.50 x10^3/uL    BASOPHIL # 0.11 <=0.20 x10^3/uL    IMMATURE GRANULOCYTE % 1 0 - 1 %    IMMATURE GRANULOCYTE # 0.16 (H) <0.10 x10^3/uL    Narrative    Platelet Count -Analyzer flag indicated platelet clumps- actual count may be higher.  Mean Platelet Volume - Not Reported   PTT (PARTIAL THROMBOPLASTIN TIME)   Result Value Ref Range    APTT 32.0 24.2 - 37.5 seconds    Narrative    Therapeutic range for unfractionated heparin is 60-100 seconds.   CBC/DIFF    Narrative    The following orders were created for panel order CBC/DIFF.  Procedure                               Abnormality         Status                     ---------                               -----------         ------  CBC WITH VOZD[[id number]]                Abnormal            Final result                 Please view results for these tests on the individual orders.   CBC WITH DIFF   Result Value Ref Range    WBC 10.8 3.7 - 11.0 x10^3/uL    RBC 4.61 4.50 - 6.10 x10^6/uL    HGB 13.0 (L) 13.4 - 17.5 g/dL    HCT 37.5 (L) 38.9 - 52.0 %    MCV 81.3 78.0 - 100.0 fL    MCH 28.2 26.0 - 32.0 pg    MCHC 34.7 31.0 - 35.5 g/dL    RDW-CV 13.8 11.5 - 15.5 %    PLATELETS 269 150 - 400 x10^3/uL    MPV 10.4 8.7 - 12.5 fL    NEUTROPHIL % 61 %    LYMPHOCYTE % 23 %    MONOCYTE % 11 %    EOSINOPHIL % 3 %    BASOPHIL % 1 %    NEUTROPHIL # 6.56 1.50 - 7.70 x10^3/uL    LYMPHOCYTE # 2.44 1.00 - 4.80 x10^3/uL    MONOCYTE # 1.20 (H) 0.20 - 1.10 x10^3/uL    EOSINOPHIL # 0.37 <=0.50 x10^3/uL    BASOPHIL # 0.10 <=0.20 x10^3/uL     IMMATURE GRANULOCYTE % 1 0 - 1 %    IMMATURE GRANULOCYTE # 0.10 (H) <0.10 x10^3/uL   PTT (PARTIAL THROMBOPLASTIN TIME) - ONCE   Result Value Ref Range    APTT 66.3 (H) 24.2 - 37.5 seconds    Narrative    Therapeutic range for unfractionated heparin is 60-100 seconds.      Radiology Results: I have personally reviewed imaging and imaging reports.  Results for orders placed or performed during the hospital encounter of 12/26/19 (from the past 72 hour(s))   CT ANGIO CHEST FOR PULMONARY EMBOLUS W IV CONTRAST     Status: None    Narrative    CT ANGIO CHEST FOR PULMONARY EMBOLUS W IV CONTRAST performed on 12/26/2019  9:49 PM    INDICATION: 49 years old Male  hx dvt with covid    TECHNIQUE: Axial images from the thoracic inlet through the lungs obtained  after administration of IV contrast with reformatted coronal and sagittal  images, utilizing soft tissue and lung algorithms    CONTRAST: 70 cc of Isovue 370    RADIATION DOSE: 303.05 mGy.cm    COMPARISON: None available    FINDINGS:  Lungs: There are innumerable bilateral groundglass opacification of the  peripheral predominance in patient with known covid 19 infection. This  decreases sensitivity for evaluation of nodules/masses. There is no   pleural  effusion, or pneumothorax. There are a few bilateral parasagittal nodes   the  largest seen along the right minor fissure. The central airways are clear.    Heart/Mediastinum: The heart is normal in size. The aorta and pulmonary  artery normal in size. There are bilateral moderate-sized filling defects  within the main pulmonary arteries, lobar and segmental pulmonary arteries  consistent with pulmonary emboli. For example on series 3 image 63 there  are filling defects within the bilateral main pulmonary arteries. No  evidence of right heart strain on CT. There is a mild degree of  atherosclerotic disease. Esophagus is decompressed. The thyroid is  homogenous.    Abdomen: Included upper  abdomen demonstrates no evidence of  acute  abnormality.    Soft Tissues/Bones: There are mild multilevel degenerative changes. The  soft tissues are unremarkable.      Impression    1.  Moderate size bilateral main, lobar and segmental pulmonary emboli  without evidence of right heart strain.  2.  Bilateral scattered consolidations with peripheral predominance in  patient with known recent covid 19 infection.    Critical findings discussed with Dr. Jordan HawksMorgan Johnson via phone by Dr.   Stevphen MeuseMorgan  Fish during dictation at approximately 10:15 PM on Dec 26, 2019.       Microbiology: No results found for any visits on 12/26/19 (from the past 96 hour(s)).     ASSESSMENT/PLAN:   49 y.o. male with PMH of HTN, hx of unprovoked PE in 2015 and recent COVID-19 infection (Positive on 12/14/2019) who presents with 3 days of left calf pain and 1 day of chest/back pain.     Bilateral Main, Lobar and Segmental Pulmonary Embolic  Cough and congestion started on 12/13/2019  COVID-19 Positive on 12/14/2019, quarantined but without any therapeutic intervention  Left Calf Pain for past 3 days with acute onset of back pain and SOB today  Tachypneic in ED maintaining oxygen saturations of 90's on RA  Heparin-Thrombotic Protocol initiated  PLAN  - Continue Heparin- Thrombotic Protocol  - TTE completed; will follow up read at follow up appointment   - Low concern for R heart strain given unremarkable troponin and lack of R heart strain noted on CT PE  - LE venous duplex not ordered as it will not change management  - Will need at least 3 months therapy and will plan to discuss any further duration with PCP  - Patient previously chose warfarin due to cost   - Anticoagulation Plan: Will begin 1 month free trial of Eliquis on discharge. Will work with outpatient Family Medicine Pharmacists to assess for viable/affordable ways to continue Eliquis. If unable, will transition to Warfarin prior to Eliquis trial pack ending.    Chronic Medical Conditions  HTN: Continue Cozaar 50 mg daily     Status: Floor  Nutrition: Regular diet  Fluids: PO  GI prophylaxis: n/i  DVT/PE: heparin drip    PT/OT: n/i  Disposition: pending     Heber CarolinaMitchell Christopher Hoyson, MD   06:31;12/27/2019       Late entry for 12/27/19. I saw and examined the patient.  I reviewed the resident's note.  I agree with the findings and plan of care as documented in the resident's note.  Any exceptions/additions are edited/noted.    Dagoberto LigasJason M Anthem Frazer, DO

## 2019-12-27 NOTE — Discharge Summary (Signed)
[facility]  DISCHARGE SUMMARY    PATIENT NAME:  Jon Murphy, Jon Murphy  MRN:  [id number]  DOB:  1970/09/21    ENCOUNTER DATE:  12/26/2019  INPATIENT ADMISSION DATE: 12/26/2019  DISCHARGE DATE:  12/27/2019    ATTENDING PHYSICIAN: Dagoberto Ligas, DO  SERVICE: DEPT OF FAMILY MEDICINE  PRIMARY CARE PHYSICIAN: Vladimir Creeks, MD         LAY CAREGIVER: Lay Caregiver Name: Teven, Mittman Caregiver Contact Number: [phone], Lay Caregiver Relationship to patient: spouse/significant other      PRIMARY DISCHARGE DIAGNOSIS: Pulmonary embolism (CMS [facility])  Active Hospital Problems    Diagnosis Date Noted   . Principle Problem: Pulmonary embolism (CMS [facility]) [I26.99] 12/26/2019      Resolved Hospital Problems   No resolved problems to display.     There are no active non-hospital problems to display for this patient.       DISCHARGE MEDICATIONS:     Current Discharge Medication List      START taking these medications.      Details   apixaban 5 mg Tablet  Commonly known as: ELIQUIS   Please take 2 tabs (10 mg total) in the morning and in the evening for 7 days. Then begin 1 tab (5 mg) in the morning and evening there on after.  Qty: 74 Tablet  Refills: 0        CONTINUE these medications - NO CHANGES were made during your visit.      Details   losartan 50 mg Tablet  Commonly known as: COZAAR   50 mg, Oral, DAILY  Qty: 90 Tab  Refills: 3          Discharge med list refreshed?  YES     During this hospitalization did the patient have an AMI, PCI/PCTA, STENT or Isolated CABG?  No                ALLERGIES:  Allergies   Allergen Reactions   . Aspirin Hives/ Urticaria             HOSPITAL PROCEDURE(S):   Bedside Procedures:  No orders of the defined types were placed in this encounter.    Surgical     REASON FOR HOSPITALIZATION AND HOSPITAL COURSE     BRIEF HPI:  This is a 49 y.o., male admitted for chest/back pain. Found to have new moderate, bilateral main, lobar, and segmental PEs without evidence of right heart strain.    BRIEF  HOSPITAL NARRATIVE: Patient was started on heparin drip. Patient remained vitally stable with no O2 requirement. TTE was acquired prior to discharge to assess cardiac function. Patient was deemed safe for discharge with plan to transition to PO Eliquis.     TRANSITION/POST DISCHARGE CARE/PENDING TESTS/REFERRALS:   - Anticoagulation Plan: Will begin 1 month free trial of Eliquis on discharge. Will work with outpatient Family Medicine Pharmacists to assess for viable/affordable ways to continue Eliquis as patient lacks medication insurance coverage. If unable to afford Eliquis, will plan to transition to Warfarin prior to Eliquis trial pack ending.  - How is chest pain going?  - Read on TTE?        CONDITION ON DISCHARGE:  A. Ambulation: Full ambulation  B. Self-care Ability: Complete  C. Cognitive Status Alert and Oriented x 3  D. Code status at discharge:   Code Status Information     Code Status    Full Code  LINES/DRAINS/WOUNDS AT DISCHARGE:   Patient Lines/Drains/Airways Status    Active Line / Dialysis Catheter / Dialysis Graft / Drain / Airway / Wound     Name: Placement date: Placement time: Site: Days:    Peripheral IV Anterior;Distal;Left;Upper Arm  12/26/19   2032   less than 1                DISCHARGE DISPOSITION:  Home discharge              DISCHARGE INSTRUCTIONS:       DISCHARGE INSTRUCTION - [place]    An appointment has been made for you in [facility] on 12/29/19 at 10:10 AM with Dr. Tama Gander. If you will be unable to come to this appointment, please call [phone] to reschedule.     If you have any questions after you are discharged if it is during business hours you can call one of the family medicine discharge nurse at [phone].  After hours,  you can call [phone] [phone]) and ask to speak to the [facility] Medicine doctor on call.                Jaynee Eagles, MD    Copies sent to Care Team       Relationship Specialty Notifications Start End     Theophilus Kinds, MD PCP - General FAMILY MEDICINE  03/17/19     Phone: [phone] Fax: [phone]         [address] [place] [address]            Referring providers can utilize [email or web address] to access their referred [place] patient's information.

## 2019-12-27 NOTE — Care Plan (Signed)
Plan of care reviewed. Pt A&OX4. Pt denies any pain thi shift. Pt up ad li. Heparin gtt continued at 17 units/kg/hr per [facility]. PTT monitored Q6. Fall precautions maintained. Pt resting in bed with call light in reach. Will continue to monitor.   Problem: Adult Inpatient Plan of Care  Goal: Plan of Care Review  Outcome: Ongoing (see interventions/notes)  Goal: Patient-Specific Goal (Individualized)  Outcome: Ongoing (see interventions/notes)  Goal: Absence of Hospital-Acquired Illness or Injury  Outcome: Ongoing (see interventions/notes)  Goal: Optimal Comfort and Wellbeing  Outcome: Ongoing (see interventions/notes)  Goal: Rounds/Family Conference  Outcome: Ongoing (see interventions/notes)

## 2019-12-29 ENCOUNTER — Other Ambulatory Visit: Payer: Self-pay

## 2019-12-29 ENCOUNTER — Ambulatory Visit: Payer: Self-pay | Attending: Family Medicine | Admitting: Family Medicine

## 2019-12-29 ENCOUNTER — Encounter (HOSPITAL_BASED_OUTPATIENT_CLINIC_OR_DEPARTMENT_OTHER): Payer: Self-pay | Admitting: Family Medicine

## 2019-12-29 VITALS — BP 128/80 | HR 83 | Temp 97.9°F | Resp 18 | Ht 69.49 in | Wt 243.4 lb

## 2019-12-29 DIAGNOSIS — I10 Essential (primary) hypertension: Secondary | ICD-10-CM | POA: Insufficient documentation

## 2019-12-29 DIAGNOSIS — I2699 Other pulmonary embolism without acute cor pulmonale: Secondary | ICD-10-CM | POA: Insufficient documentation

## 2019-12-29 DIAGNOSIS — Z09 Encounter for follow-up examination after completed treatment for conditions other than malignant neoplasm: Secondary | ICD-10-CM

## 2019-12-29 DIAGNOSIS — Z7901 Long term (current) use of anticoagulants: Secondary | ICD-10-CM

## 2019-12-29 DIAGNOSIS — Z79899 Other long term (current) drug therapy: Secondary | ICD-10-CM | POA: Insufficient documentation

## 2019-12-29 NOTE — Progress Notes (Signed)
Department of [facility] Transition of Care Follow-up Note    ID  Jon Murphy  [id number]  1970-11-01  Date of Encounter: 12/29/2019     Transition of Care Contact  Discharge Date: Not Found  Transition Facility Type  Facility Name  Interactive Contact(s): Completed or attempted contact indicated by Date/Time  Completed Contact  First Attempt  Second Attempt  Third Attempt  Clinical Staff Name/Role who contacted  Transition Note:      Further information may be documented in relevant telephone or outreach encounter.       [facility] discharge follow-up appointment    HPI  Jon Murphy is a 50 y.o. male admitted to the hospital 12/26/19 and discharged 12/27/19 for new PE.   Brief summary of admission:   BRIEF HPI:  This is a 49 y.o., male admitted for chest/back pain. Found to have new moderate, bilateral main, lobar, and segmental PEs without evidence of right heart strain.    BRIEF HOSPITAL NARRATIVE: Patient was started on heparin drip. Patient remained vitally stable with no O2 requirement. TTE was acquired prior to discharge to assess cardiac function. Patient was deemed safe for discharge with plan to transition to PO Eliquis.     TRANSITION/POST DISCHARGE CARE/PENDING TESTS/REFERRALS:   - Anticoagulation Plan: Will begin 1 month free trial of Eliquis on discharge. Will work with outpatient Family Medicine Pharmacists to assess for viable/affordable ways to continue Eliquis as patient lacks medication insurance coverage. If unable to afford Eliquis, will plan to transition to Warfarin prior to Eliquis trial pack ending.  - How is chest pain going?  - Read on TTE?    Since being released from the hospital:   He states that he has been doing well generally. Endorses some mild fatigue and anterior chest pain. No issues taking Eliquis. Denies fevers, chills, nausea, vomiting, changes in bowel or bladder habits.      Allergies   Allergen Reactions    Aspirin Hives/ Urticaria      Medication  Reconciliation was performed today.  Current Outpatient Medications   Medication Sig    apixaban (ELIQUIS) 5 mg Oral Tablet Please take 2 tabs (10 mg total) in the morning and in the evening for 7 days. Then begin 1 tab (5 mg) in the morning and evening there on after.    losartan (COZAAR) 50 mg Oral Tablet Take 1 Tab (50 mg total) by mouth Once a day      Medical and Surgical History reviewed with patient today, any changes have been corrected in the EMR.    REVIEW OF SYSTEMS: As discussed above in the HPI; otherwise all other systems negative.Marland Kitchen     PHYSICAL EXAM  Blood pressure 128/80, pulse 83, temperature 36.6 C (97.9 F), temperature source Thermal Scan, resp. rate 18, height 1.765 m (5' 9.49"), weight 110 kg (243 lb 6.2 oz), SpO2 97 %. Body mass index is 35.44 kg/m.  General: appears in good health and no distress  Eyes: Conjunctiva clear.  HENT:ENMT without erythema or injection, mucous membranes moist.  Neck: no thyromegaly or lymphadenopathy  Lungs: Clear to auscultation bilaterally.   Cardiovascular: regular rate and rhythm  Abdomen: Soft, non-tender, Bowel sounds normal  Extremities: No cyanosis or edema  Skin: Skin warm and dry  Neurologic: CN II - XII grossly intact , Alert and oriented x3  Psychiatric: Normal affect, behavior, memory, thought content, judgement, and speech.    LABS/IMAGING  I personally reviewed the labs and imaging  obtained during the hospitalization.    ASSESSMENT/PLAN  Jon Murphy 49 y.o. male  seen today for initial visit after discharge from the hospital. He was admitted 12/26/19 and discharged 12/27/19.     1. Final discharge diagnosis: Pulmonary Embolism  -- I have reviewed in detail the patient's discharge summary.   -- Medications were reconciled and appropriate medication education was provided.   -- Appropriate education about the hospitalization and care coordination was also provided today.  -- Tests or treatments that are pending or require follow up: Pending Eliquis  coverage; pharmacists noted  -- Changes to plan: Will plan to transition to Warfarin should him being unable to get his Eliquis covered     Co-morbid disease management:    Chronic Medical Conditions  HTN: Continue Cozaar 50 mg daily    RTC 2 weeks with PCP prn.    Heber Carolina, MD          I saw and examined the patient.  I reviewed the resident's note.  I agree with the findings and plan of care as documented in the resident's note.  Any exceptions/additions are edited/noted.    Vladimir Creeks, MD

## 2019-12-30 ENCOUNTER — Encounter (HOSPITAL_BASED_OUTPATIENT_CLINIC_OR_DEPARTMENT_OTHER): Payer: Self-pay | Admitting: Family Medicine

## 2019-12-30 ENCOUNTER — Encounter (HOSPITAL_BASED_OUTPATIENT_CLINIC_OR_DEPARTMENT_OTHER): Payer: Self-pay

## 2019-12-30 NOTE — Progress Notes (Signed)
This MSW received requests from Cherlyn Labella and Dr. Saunders Revel to mail pt assistance application for Eliquis and send a referral to [facility] program. This MSW mailed the Eliquis application to pt's home address and sent a [employer] referral to [employer].   This MSW left Eliquis application prescriber's portion for Dr. Mena Pauls signature because Dr. Rogers Seeds DEA and state license number are not accepted a s a resident.     Linkon Siverson was provided with information about the following community resources:  Prescription Medication assistance

## 2019-12-31 NOTE — ED Attending Note (Signed)
Emergency Department Attending Supervisory Note      HPI:  I saw this patient in conjunction with Dr. Wynetta Emery, see primary note for full H&P.    Jon Murphy is a 49 y.o. male who presents with shortness of breath and chest pain.  Similar to previous PEs.  His COVID positive.      Physical Exam:    See primary note for complete physical exam.      Labs:  Labs Reviewed   BASIC METABOLIC PANEL - Abnormal; Notable for the following components:       Result Value    CO2 TOTAL 19 (*)     ANION GAP 14 (*)     All other components within normal limits    Narrative:     Hemolysis can alter results at this level (slight).  Lipemia can alter results at this level (mild).  Lipemia alters results at this level (mild). Repeat testing with new specimen suggested.   VENOUS BLOOD GAS WITH LACTATE REFLEX - Abnormal; Notable for the following components:    PCO2 (VENOUS) 37.00 (*)     All other components within normal limits   CBC WITH DIFF - Abnormal; Notable for the following components:    WBC 12.3 (*)     NEUTROPHIL # 8.14 (*)     MONOCYTE # 1.24 (*)     IMMATURE GRANULOCYTE # 0.16 (*)     All other components within normal limits    Narrative:     Platelet Count -Analyzer flag indicated platelet clumps- actual count may be higher.  Mean Platelet Volume - Not Reported   HEPATIC FUNCTION PANEL - Normal    Narrative:     Lipemia alters results at this level (mild). Repeat testing with new specimen suggested.  Lipemia alters results at this level (mild). Repeat testing with new specimen suggested.  Lipemia can alter results at this level (mild).  Lipemia can alter results at this level (mild).  Hemolysis can alter results at this level (slight).  Lipemia can alter results at this level (mild).   MAGNESIUM - Normal    Narrative:     Hemolysis can alter results at this level (slight).  Lipemia alters results at this level (mild). Repeat testing with new specimen suggested.   PHOSPHORUS - Normal    Narrative:     Hemolysis can  alter results at this level (slight).   PT/INR - Normal    Narrative:     Coumadin therapy INR range for Conventional Anticoagulation is 2.0 to 3.0 and for Intensive Anticoagulation 2.5 to 3.5.   TROPONIN-I (FOR ED ONLY) - Normal   PTT (PARTIAL THROMBOPLASTIN TIME) - Normal    Narrative:     Therapeutic range for unfractionated heparin is 60-100 seconds.   CBC/DIFF    Narrative:     The following orders were created for panel order CBC/DIFF.  Procedure                               Abnormality         Status                     ---------                               -----------         ------  CBC WITH DJSH[[id number]]                Abnormal            Final result                 Please view results for these tests on the individual orders.         Imaging:    CT ANGIO CHEST FOR PULMONARY EMBOLUS W IV CONTRAST   Final Result by Mayra Neer, MD (05/07 2334)   1.  Moderate size bilateral main, lobar and segmental pulmonary emboli   without evidence of right heart strain.   2.  Bilateral scattered consolidations with peripheral predominance in   patient with known recent covid 19 infection.      Critical findings discussed with Dr. Jordan Hawks via phone by Dr.    Stevphen Meuse during dictation at approximately 10:15 PM on Dec 26, 2019.             Therapy/Procedures/Course/MDM:  Patient present with signs symptoms suggestive of pulmonary emboli.  No right heart strain a bedside echo.  CT scan positive for significant PEs.  Admitted to Medicine.    Critical Care: Patient was suffering from a life/organ-threatening illness. A total of were spent in initial evaluation/review of patient presentation, reviewing labs/imaging, documentation, discussion with consultants, and discussion with patient and family.  Impression:   Encounter Diagnoses   Name Primary?   Marland Kitchen Dyspnea    . Pulmonary HTN (CMS HCC)      Disposition:    Patient will be admitted for further evaluation and management.         This note was partially generated using MModal Fluency Direct system, and there may be some incorrect words, spellings, and punctuation that were not noted in checking the note before saving.  Edmonia Lynch, MD 12/31/2019, 09:15  [place] Department of Emergency Medicine

## 2020-01-07 ENCOUNTER — Encounter (HOSPITAL_BASED_OUTPATIENT_CLINIC_OR_DEPARTMENT_OTHER): Payer: Self-pay | Admitting: Family Medicine

## 2020-01-07 ENCOUNTER — Telehealth (HOSPITAL_BASED_OUTPATIENT_CLINIC_OR_DEPARTMENT_OTHER): Payer: Self-pay | Admitting: Family Medicine

## 2020-01-07 ENCOUNTER — Encounter (HOSPITAL_BASED_OUTPATIENT_CLINIC_OR_DEPARTMENT_OTHER): Payer: Self-pay

## 2020-01-07 NOTE — Telephone Encounter (Signed)
This MSW received completed Jon Murphy pt assistance application for Eliquis along with income documentation from pt. Prescriber section left for your signature along with request for prescription for Eliquis.

## 2020-01-07 NOTE — Progress Notes (Signed)
This MSW received completed [name] pt assistance application for Eliquis along with income documentation from pt. Prescriber section left for Dr. Mena Pauls signature along with request for prescription for Eliquis.      Copy of documents left to be scanned in to pt's medical record.     Jon Murphy was provided with information about the following community resources:  Prescription Medication assistance

## 2020-01-12 ENCOUNTER — Encounter (HOSPITAL_BASED_OUTPATIENT_CLINIC_OR_DEPARTMENT_OTHER): Payer: Self-pay | Admitting: Family Medicine

## 2020-01-12 NOTE — Nursing Note (Signed)
[name] forms were completed and fax to [phone] on 01/09/20 per Dr. Shela Commons. [name]. Confirmation was received and placed in scanning.   Jon Murphy, [place]

## 2020-01-12 NOTE — Nursing Note (Signed)
Patient Assistance faxed to [facility] [employer] [phone]). Confirmation received 01-07-20 @ 2:02 pm.  Laury Deep, LPN

## 2020-01-13 ENCOUNTER — Encounter (HOSPITAL_BASED_OUTPATIENT_CLINIC_OR_DEPARTMENT_OTHER): Payer: Self-pay

## 2020-01-13 NOTE — Progress Notes (Signed)
This MSW faxed the pt's portion of [employer] and income documentation to [employer] Patient Assistance Program since PCP's portion was already faxed according to chart note below.        Please see previous MA's note "Jon Murphy [employer] forms were completed and fax to [phone] on 01/09/20 per Dr. Shela Commons. [place]. Confirmation was received and placed in scanning.   Jon Murphy, Jon Murphy"      Jon Murphy was provided with information about the following community resources:  Prescription Medication assistance

## 2020-01-14 ENCOUNTER — Encounter (HOSPITAL_BASED_OUTPATIENT_CLINIC_OR_DEPARTMENT_OTHER): Payer: Self-pay | Admitting: Family Medicine

## 2020-01-16 ENCOUNTER — Encounter (HOSPITAL_BASED_OUTPATIENT_CLINIC_OR_DEPARTMENT_OTHER): Payer: Self-pay

## 2020-01-16 NOTE — Progress Notes (Signed)
This MSW received a call from pt who stated that he could not get ahold of Dr. Waylan Rocher nurse or Dr. [name] and that Dr. [name] needed to correct the [facility] Patient Assistance form before they could send his medication. This MSW made the recommended changes after being on the phone with Alver Fisher for 45 minutes. Document was faxed and then this MSW phoned pt to let him know to call back to patient assistance to make sure that the fax was received. Pt agreed. This MSW reminded pt that if it is not correct, it would need to be handled by Dr. [name] or a nurse as this MSW is neither.         Jon Murphy was provided with information about the following community resources:  Prescription Medication assistance

## 2020-03-30 ENCOUNTER — Encounter (HOSPITAL_BASED_OUTPATIENT_CLINIC_OR_DEPARTMENT_OTHER): Payer: Self-pay | Admitting: Family Medicine

## 2020-04-05 ENCOUNTER — Encounter (HOSPITAL_BASED_OUTPATIENT_CLINIC_OR_DEPARTMENT_OTHER): Payer: Self-pay | Admitting: Family Medicine

## 2020-04-13 ENCOUNTER — Ambulatory Visit: Payer: Self-pay | Attending: Family Medicine | Admitting: Family Medicine

## 2020-04-13 ENCOUNTER — Other Ambulatory Visit: Payer: Self-pay

## 2020-04-13 ENCOUNTER — Encounter (HOSPITAL_BASED_OUTPATIENT_CLINIC_OR_DEPARTMENT_OTHER): Payer: Self-pay | Admitting: Family Medicine

## 2020-04-13 VITALS — BP 114/60 | HR 74 | Temp 97.7°F | Ht 69.0 in | Wt 248.7 lb

## 2020-04-13 DIAGNOSIS — I10 Essential (primary) hypertension: Secondary | ICD-10-CM | POA: Insufficient documentation

## 2020-04-13 DIAGNOSIS — I2699 Other pulmonary embolism without acute cor pulmonale: Secondary | ICD-10-CM

## 2020-04-13 MED ORDER — LOSARTAN 50 MG TABLET
50.00 mg | ORAL_TABLET | Freq: Every day | ORAL | 3 refills | Status: DC
Start: 2020-04-13 — End: 2020-11-15

## 2020-04-13 NOTE — Progress Notes (Signed)
Department of Family Medicine   Progress Note    Jon Murphy  MRN: [id number]  DOB: Apr 05, 1971  Date of Service: 04/13/2020    CHIEF COMPLAINT  Chief Complaint   Patient presents with    Hospital Follow Up     pulmonary embolism       SUBJECTIVE  Jon Murphy is a 49 y.o. male who presents to clinic for follow up.       Patient has hypertension. Patient is tolerating treatment regimen without side effects. Blood pressures at home are well controlled.     PE - active and stable. Asymptomatic. Doing well on Eliquis.       Review of Systems:  Positive ROS discussed in HPI, otherwise all other systems negative.      Medications:   apixaban (ELIQUIS) 5 mg Oral Tablet, Please take 2 tabs (10 mg total) in the morning and in the evening for 7 days. Then begin 1 tab (5 mg) in the morning and evening there on after.  losartan (COZAAR) 50 mg Oral Tablet, Take 1 Tab (50 mg total) by mouth Once a day    No facility-administered medications prior to visit.      Allergies:   Allergies   Allergen Reactions    Aspirin Hives/ Urticaria       Social History     Tobacco Use    Smoking status: Never Smoker    Smokeless tobacco: Never Used   Vaping Use    Vaping Use: Never used   Substance Use Topics    Alcohol use: Not Currently    Drug use: Never       OBJECTIVE  BP 114/60    Pulse 74    Temp 36.5 C (97.7 F) (Thermal Scan)    Ht 1.753 m (5\' 9" )    Wt 113 kg (248 lb 10.9 oz)    SpO2 98%    BMI 36.72 kg/m       General: no distress  HENT: TMs clear, mouth mucous membranes moist, pharynx without injection or exudate   Lungs: clear to auscultation bilaterally  Cardiovascular: RRR, no murmur  Abdomen: soft, non tender, bowel sounds present  Extremities: no cyanosis or edema  Skin: warm and dry, no rash  Neurologic: gait is normal, AOx3, CN 2-12 grossly intact  Psychiatric: normal affect and behavior    ASSESSMENT/PLAN      (I26.99) Pulmonary embolism, unspecified chronicity, unspecified pulmonary embolism type, unspecified  whether acute cor pulmonale present (CMS HCC)  (primary encounter diagnosis)  Plan: stable  Provoked by covid 19 infection  Did have PE over ten years ago, provoked by long car ride trip to ; had long discussion for duration of treatment.   Says he saw a hematologist after first clot; testing done at that time was negative, they did not recommend AC at that time.    He has made decision to stop Eliquis after 6 months; discussed risks and benefits.     (I10) Essential hypertension  Plan: well controlled  Continue medication           Orders Placed This Encounter    losartan (COZAAR) 50 mg Oral Tablet         Return in about 6 months (around 10/14/2020), or if symptoms worsen or fail to improve.      10/16/2020, MD 04/13/2020, 08:41

## 2020-08-25 ENCOUNTER — Encounter (HOSPITAL_BASED_OUTPATIENT_CLINIC_OR_DEPARTMENT_OTHER): Payer: Self-pay | Admitting: Family Medicine

## 2020-08-26 ENCOUNTER — Encounter (HOSPITAL_BASED_OUTPATIENT_CLINIC_OR_DEPARTMENT_OTHER): Payer: Self-pay | Admitting: Family Medicine

## 2020-10-18 ENCOUNTER — Encounter (HOSPITAL_BASED_OUTPATIENT_CLINIC_OR_DEPARTMENT_OTHER): Payer: Self-pay | Admitting: Family Medicine

## 2020-10-25 ENCOUNTER — Encounter (HOSPITAL_BASED_OUTPATIENT_CLINIC_OR_DEPARTMENT_OTHER): Payer: Self-pay | Admitting: Family Medicine

## 2020-11-15 ENCOUNTER — Ambulatory Visit: Payer: Self-pay | Attending: Family Medicine | Admitting: Family Medicine

## 2020-11-15 ENCOUNTER — Encounter (HOSPITAL_BASED_OUTPATIENT_CLINIC_OR_DEPARTMENT_OTHER): Payer: Self-pay | Admitting: Family Medicine

## 2020-11-15 ENCOUNTER — Other Ambulatory Visit: Payer: Self-pay

## 2020-11-15 VITALS — BP 116/72 | HR 70 | Temp 97.7°F | Wt 250.7 lb

## 2020-11-15 DIAGNOSIS — I2699 Other pulmonary embolism without acute cor pulmonale: Secondary | ICD-10-CM | POA: Insufficient documentation

## 2020-11-15 DIAGNOSIS — I10 Essential (primary) hypertension: Secondary | ICD-10-CM | POA: Insufficient documentation

## 2020-11-15 DIAGNOSIS — Z23 Encounter for immunization: Secondary | ICD-10-CM | POA: Insufficient documentation

## 2020-11-15 MED ORDER — LOSARTAN 50 MG TABLET
50.00 mg | ORAL_TABLET | Freq: Every day | ORAL | 3 refills | Status: DC
Start: 2020-11-15 — End: 2022-11-27

## 2020-11-15 NOTE — Progress Notes (Signed)
Department of Family Medicine   Progress Note    Douglass Dunshee  MRN: [id number]  DOB: Aug 30, 1970  Date of Service: 11/15/2020    CHIEF COMPLAINT  Chief Complaint   Patient presents with    Follow Up 6 Months       SUBJECTIVE  Jon Murphy is a 50 y.o. male who presents to clinic for follow up.       Patient has hypertension. Patient is tolerating treatment regimen without side effects. Blood pressures at home are well controlled.     PE - active and stable. Asymptomatic. finished Eliquis therapy for 6 month treatment approximately 3-4 months ago.       Review of Systems:  Positive ROS discussed in HPI, otherwise all other systems negative.      Medications:   losartan (COZAAR) 50 mg Oral Tablet, Take 1 Tablet (50 mg total) by mouth Once a day  multivit-minerals/folic acid (ADULT MULTIVITAMIN GUMMIES ORAL), Take 1 Tablet by mouth Once a day  apixaban (ELIQUIS) 5 mg Oral Tablet, Please take 2 tabs (10 mg total) in the morning and in the evening for 7 days. Then begin 1 tab (5 mg) in the morning and evening there on after. (Patient not taking: Reported on 11/15/2020)    No facility-administered medications prior to visit.      Allergies:   Allergies   Allergen Reactions    Aspirin Hives/ Urticaria       Social History     Tobacco Use    Smoking status: Never Smoker    Smokeless tobacco: Never Used   Vaping Use    Vaping Use: Never used   Substance Use Topics    Alcohol use: Not Currently    Drug use: Never       OBJECTIVE  BP 116/72 (Site: Left, Patient Position: Sitting, Cuff Size: Adult Large)    Pulse 70    Temp 36.5 C (97.7 F) (Thermal Scan)    Wt 114 kg (250 lb 10.6 oz)    SpO2 98%    BMI 37.02 kg/m       General: no distress  Lungs: clear to auscultation bilaterally  Cardiovascular: RRR, no murmur  Skin: warm and dry, no rash  Neurologic: gait is normal, AOx3, CN 2-12 grossly intact  Psychiatric: normal affect and behavior    ASSESSMENT/PLAN      (I26.99) Pulmonary embolism, unspecified chronicity,  unspecified pulmonary embolism type, unspecified whether acute cor pulmonale present (CMS HCC)  (primary encounter diagnosis)  Plan: stable  Has finished Eliquis therapy s/p PE after covid infection    He has made decision to stop Eliquis after 6 months; discussed risks and benefits.     (I10) Essential hypertension  Plan: well controlled  Continue medication     Is on modified keto diet, wants [place] to know for lipid panel      Tetanus booster given      Orders Placed This Encounter    Tdap Vaccine-Boostrix->=10 yrs(Admin)    BASIC METABOLIC PANEL    LIPID PANEL    ALT (SGPT)    AST (SGOT)    losartan (COZAAR) 50 mg Oral Tablet         Return in about 6 months (around 05/18/2021), or if symptoms worsen or fail to improve.    On the day of the encounter, a total of  30 ([phone], [phone], [phone]) minutes was spent on this patient encounter including review of historical information, examination, documentation and post-visit activities.  Vladimir Creeks, MD 11/15/2020, 08:55

## 2020-11-15 NOTE — Nursing Note (Signed)
Patient received vaccine in clinic.  Tolerated it well, given VIS sheet and was discharged to home.  Immunization administered     Name Date Dose VIS Date Route    Tetanus Toxoid/Diphtheria Toxoid/Acellular Pertussis Vaccine, Adsorbed (Tdap) 11/15/2020 0.5 mL 03/26/2020 Intramuscular    Site: Left deltoid    Given By: Azzie Roup, RN    Manufacturer: [employer]    Lot: [id number]    NDC: [id number]        Azzie Roup, RN  11/15/2020, 09:14

## 2020-12-30 ENCOUNTER — Encounter (HOSPITAL_BASED_OUTPATIENT_CLINIC_OR_DEPARTMENT_OTHER): Payer: Self-pay

## 2020-12-30 NOTE — Progress Notes (Signed)
This MSW received the following messages regarding patient assistance program for Eliquis. This MSW will check with pt through MyChart message to be sure he is not taking this medication any longer.       "Hi Mazee,     This patient has never been seen in our clinic or by cardiology. It looks like the medication was prescribed by Heber [place] and his primary care physician is Vladimir Creeks. Please reach out to their office.     Thanks  -Rachael    Rachael Broscious, PharmD, [facility]  Clinical Pharmacy Specialist  Advanced Heart Failure/Pulmonary Arterial Hypertension  [facility]  Phone: [phone]  Pager: [phone]    From: Jerene [email or web address]>  Sent: Thursday, Dec 30, 2020 3:16 PM  To: [facility] Patient Access [email or web address]>; Broadus John. [email or web address]>; Broscious, Rachael E. [email or web address]>; Leotis Pain [email or web address]>; Blanchard Mane. [email or web address]>  Subject: Patient Assistance prescription request form [id number]      Hey all,    We received a fax for patient Jon Murphy regarding a prescription request for his medication Eliquis.    I was just wondering if you guys knew if this patient is continuing free drug for this medication. It looks like on 08/25/20 he was instruction to stop taking the medication but I just wanted to double check! I am also adding the patients social worker who worked on the application last year    If you guys have any questions please let me know. Thank you so much!    Corrie Dandy, CPHT  Clinical Pharmacy Technician ll    [facility]   [address]  [place], [place] [address]  Phone: [phone] ext. [phone]  "    Jon Murphy was provided with information about the following community resources:  Prescription Medication assistance

## 2021-05-23 ENCOUNTER — Encounter (HOSPITAL_BASED_OUTPATIENT_CLINIC_OR_DEPARTMENT_OTHER): Payer: Self-pay | Admitting: Family Medicine

## 2021-08-29 ENCOUNTER — Emergency Department (EMERGENCY_DEPARTMENT_HOSPITAL): Payer: Self-pay

## 2021-08-29 ENCOUNTER — Emergency Department
Admission: EM | Admit: 2021-08-29 | Discharge: 2021-08-29 | Disposition: A | Discharge status: Home / Self Care | Payer: Self-pay | Attending: Emergency Medicine | Admitting: Emergency Medicine

## 2021-08-29 ENCOUNTER — Other Ambulatory Visit: Payer: Self-pay

## 2021-08-29 DIAGNOSIS — R079 Chest pain, unspecified: Secondary | ICD-10-CM

## 2021-08-29 DIAGNOSIS — M546 Pain in thoracic spine: Secondary | ICD-10-CM | POA: Insufficient documentation

## 2021-08-29 DIAGNOSIS — J9811 Atelectasis: Secondary | ICD-10-CM | POA: Insufficient documentation

## 2021-08-29 DIAGNOSIS — Z86711 Personal history of pulmonary embolism: Secondary | ICD-10-CM | POA: Insufficient documentation

## 2021-08-29 DIAGNOSIS — Z7982 Long term (current) use of aspirin: Secondary | ICD-10-CM | POA: Insufficient documentation

## 2021-08-29 LAB — BASIC METABOLIC PANEL
ANION GAP: 12 mmol/L (ref 4–13)
BUN/CREA RATIO: 23 — ABNORMAL HIGH (ref 6–22)
BUN: 19 mg/dL (ref 8–25)
CALCIUM: 9.3 mg/dL (ref 8.5–10.0)
CHLORIDE: 103 mmol/L (ref 96–111)
CO2 TOTAL: 21 mmol/L — ABNORMAL LOW (ref 22–30)
CREATININE: 0.84 mg/dL (ref 0.75–1.35)
ESTIMATED GFR: >90 mL/min/BSA (ref >=60)
GLUCOSE: 99 mg/dL (ref 65–125)
POTASSIUM: 4.6 mmol/L (ref 3.5–5.1)
SODIUM: 136 mmol/L (ref 136–145)

## 2021-08-29 LAB — ECG 12-LEAD
Atrial Rate: 66 {beats}/min
Calculated P Axis: 34 degrees
Calculated R Axis: -5 degrees
Calculated T Axis: 11 degrees
PR Interval: 198 ms
QRS Duration: 98 ms
QT Interval: 404 ms
QTC Calculation: 423 ms
Ventricular rate: 66 {beats}/min

## 2021-08-29 LAB — CBC WITH DIFF
BASOPHIL #: <0.1 10*3/uL (ref <=0.20)
BASOPHIL %: 1 %
EOSINOPHIL #: 0.17 10*3/uL (ref <=0.50)
EOSINOPHIL %: 2 %
HCT: 43.3 % (ref 38.9–52.0)
HGB: 14.8 g/dL (ref 13.4–17.5)
IMMATURE GRANULOCYTE #: <0.1 10*3/uL (ref <0.10)
IMMATURE GRANULOCYTE %: 1 % (ref 0–1)
LYMPHOCYTE #: 2.51 10*3/uL (ref 1.00–4.80)
LYMPHOCYTE %: 25 %
MCH: 29.1 pg (ref 26.0–32.0)
MCHC: 34.2 g/dL (ref 31.0–35.5)
MCV: 85.2 fL (ref 78.0–100.0)
MONOCYTE #: 0.93 10*3/uL (ref 0.20–1.10)
MONOCYTE %: 9 %
MPV: 10.3 fL (ref 8.7–12.5)
NEUTROPHIL #: 6.2 10*3/uL (ref 1.50–7.70)
NEUTROPHIL %: 62 %
PLATELETS: 288 10*3/uL (ref 150–400)
RBC: 5.08 10*6/uL (ref 4.50–6.10)
RDW-CV: 14 % (ref 11.5–15.5)
WBC: 10 10*3/uL (ref 3.7–11.0)

## 2021-08-29 LAB — HIV1/HIV2 SCREEN, COMBINED ANTIGEN AND ANTIBODY: HIV SCREEN, COMBINED ANTIGEN & ANTIBODY: NEGATIVE

## 2021-08-29 LAB — TROPONIN-I (FOR ED ONLY): TROPONIN I: <7 ng/L (ref 0–30)

## 2021-08-29 MED ORDER — SODIUM CHLORIDE 0.9 % (FLUSH) INJECTION SYRINGE
2.0000 mL | INJECTION | INTRAMUSCULAR | Status: DC | PRN
Start: 2021-08-29 — End: 2021-08-30

## 2021-08-29 MED ORDER — IOPAMIDOL 370 MG IODINE/ML (76 %) INTRAVENOUS SOLUTION
80.0000 mL | INTRAVENOUS | Status: AC
Start: 2021-08-29 — End: 2021-08-29
  Administered 2021-08-29: 80 mL via INTRAVENOUS

## 2021-08-29 MED ORDER — DEXTROSE 5% IN WATER (D5W) FLUSH BAG - 250 ML
INTRAVENOUS | Status: DC | PRN
Start: 2021-08-29 — End: 2021-08-30

## 2021-08-29 MED ORDER — SODIUM CHLORIDE 0.9% FLUSH BAG - 250 ML
INTRAVENOUS | Status: DC | PRN
Start: 2021-08-29 — End: 2021-08-30

## 2021-08-29 MED ORDER — SODIUM CHLORIDE 0.9 % (FLUSH) INJECTION SYRINGE
2.0000 mL | INJECTION | Freq: Three times a day (TID) | INTRAMUSCULAR | Status: DC
Start: 2021-08-29 — End: 2021-08-30
  Administered 2021-08-29: 0 mL

## 2021-08-29 NOTE — ED Provider Notes (Signed)
[facility] - Emergency Department   Resident Provider Note    Name: Jon Murphy  Age and Gender: 51 y.o. male  PCP: Theophilus Kinds, MD  Attending: [place]    Triage Note:   Chest Pain  (Patient states left side chest pain started 7am and radiates to his left shoulder and back. States pain started in back and radiated to chest. Has PMH of PE's 2 years ago. Patient states he had multiple air flights this week and driving. )      Clinical Impression:     Clinical Impression   Left-sided thoracic back pain, unspecified chronicity (Primary)      Course and MDM:  Patient seen and examined. Labs and imaging reviewed.  Jon Murphy is a 51 y.o. male presenting with left-sided back pain with chest discomfort.  Relevant/pertinent previous medical records reviewed via chart review activity and/or CareEverywhere activity.    Medical Decision Making  Left-sided thoracic back pain, unspecified chronicity: acute illness or injury  Amount and/or Complexity of Data Reviewed  External Data Reviewed: notes.  Labs: ordered. Decision-making details documented in ED Course.  Radiology: ordered. Decision-making details documented in ED Course.  ECG/medicine tests: ordered and independent interpretation performed. Decision-making details documented in ED Course.           ED Course as of 08/29/21 2331   Molli Knock Aug 29, 2021   2147 Patient presenting with left CP/back pain that started after he started driving home from the airport today.  He reports that for the last week he has been flying and driving for most of the week, he has a history of 2 previous PEs not on any anticoagulation other than baby aspirin.  He reports that this feeling feels similar to his last PE, so he wanted to be checked out for possible repeat PE.  He denies any swelling in his extremities, but reports that when he 1st noticed this he did have some SOB, palpitations, and feeling like his legs were warm.  On exam, no peripheral edema, lung  CTAB, RRR.  Given patient's history, CTA chest ordered to look for PE, however will also look for possible aortic dissection given patient's description of back pain.  On exam, patient does have pinpoint tenderness over the left trapezius muscle, so it is also possible that this is MSK.   2148 ECG 12-LEAD  NSR, no ST elevations or depressions.   2148 CREATININE: 0.84   2148 HGB: 14.8   2148 TROPONIN-I: <7   2148 Patient has no history of coronary artery disease or MI. given normal EKG and troponin, low concern for ACS as cause of patient's pain.     2328 CT ANGIO CHEST FOR PULMONARY EMBOLUS W IV CONTRAST  Negative for any acute cardiopulmonary process such as PE or pulmonary edema.   2328 XR AP MOBILE CHEST  Scattered linear atelectasis.   2328 Given the above information, no need to start the patient on Do-acs at this time.  Given patient's thoracic paraspinal tenderness on exam, it is likely that it is MSK in nature and not a PE.  Discussed return precautions with the patient including changes in pain, worsening pain, new CP, syncope, or any other new/concerning symptoms.  Patient instructed to follow-up with his PCP.          Following the above history, physical exam, and studies, the patient was deemed stable and suitable for discharge. The patient was advised to return to the ED for any new  or worsening symptoms. Discharge, medication and follow up instructions were discussed with the patient, who verbalized understanding. The patient is comfortable with the plan of care.    Disposition: Discharged    Follow up:   Theophilus Kinds, [place] [address]  [phone]    Schedule an appointment as soon as possible for a visit in 3 days      [facility]  [place] [address] [place]  [phone]    As needed, If symptoms  worsen    --------------------------------------------------------------------------------------------------------------------------------  HPI:  Jon Murphy is a 51 y.o. male  who presents to the ED today for chest pain. Patient reports onset of left sided chest pain at 07:00 this morning. He states describes the pain as soreness and states it begins on the left side of his back and radiates to his chest and left shoulder. Patient states the pain comes on when changing positions and taking deep breaths. He states the pain is not severe and almost feels like a sore muscle. Patient reports some associated light headedness, mild SOB, and palpitations earlier today which prompted him to call EMS. Patient reports a medical history of two PE and states his pain feels somewhat similar. Patient states he is not on any anticoagulants at this time but has taken an aspirin 81 mg daily for "quite some time". Patient endorses multiple long plane rides and car travel in the past week. Patient denies any history of a heart attacks. He denies any leg swelling or other complaints/concerns at this time.      History Limitations: None       Below information obtained from chart review and reviewed with patient:  Past Medical History:   Diagnosis Date   . Essential hypertension, benign          Medications Prior to Admission     Prescriptions    losartan (COZAAR) 50 mg Oral Tablet    Take 1 Tablet (50 mg total) by mouth Once a day    multivit-minerals/folic acid (ADULT MULTIVITAMIN GUMMIES ORAL)    Take 1 Tablet by mouth Once a day         Allergies   Allergen Reactions   . Aspirin Hives/ Urticaria           Family History   Problem Relation Age of Onset   . Diabetes Mother    . Cancer Mother    . Coronary Artery Disease Father    . Healthy Son    . Healthy Son    . Healthy Son         Social History     Tobacco Use   . Smoking status: Never   . Smokeless tobacco: Never   Substance Use Topics   . Alcohol use: Not Currently         Objective:  Nursing notes reviewed  ED Triage Vitals [08/29/21 2028]   BP (Non-Invasive) (!) 168/96   Heart Rate 65   Respiratory Rate 18   Temperature 36.6 C (97.9 F)   SpO2 97 %   Weight 110 kg (241 lb 10 oz)   Height 1.753 m (5\' 9" )     Filed Vitals:    08/29/21 2028 08/29/21 2115 08/29/21 2215 08/29/21 2315   BP: (!) 168/96   126/88   Pulse: 65 68 83 65   Resp: 18 13 18 19    Temp: 36.6 C (97.9 F)  SpO2: 97% 99% 97% 96%       Physical Exam  Physical Exam  Constitutional: Pleasant 51 y.o. male appears stated age in good health, normal color, no cyanosis. Resting in bed in no acute distress  HENT:   Head: Normocephalic and atraumatic.   Mouth/Throat:  Moist oral mucous membranes.   Eyes: EOMI, PERRL  Cardiovascular: Regular rate and rhythm. Intact distal pulses.  Pulmonary/Chest: Breath sounds equal bilaterally. No respiratory distress. No wheezes, rales, or rhonchi.   Abdominal: Abdomen soft, nontender, no rebound or guarding.  Back: No midline spinal tenderness, left thoracic paraspinal tenderness just medial to the scapula.  Musculoskeletal: No edema, tenderness or deformity.  Skin: Warm and dry.  Psychiatric: Normal mood and affect. Behavior is normal.   Neurological: Patient alert and responsive      Labs:   Labs Reviewed   BASIC METABOLIC PANEL - Abnormal; Notable for the following components:       Result Value    CO2 TOTAL 21 (*)     BUN/CREA RATIO 23 (*)     All other components within normal limits    Narrative:     Hemolysis can alter results at this level (mild).  Hemolysis can alter results at this level (mild).  Lipemia can alter results at this level (mild).  Lipemia alters results at this level (mild). Repeat testing with new specimen suggested.   TROPONIN-I (FOR ED ONLY) - Normal   HIV1/HIV2 SCREEN, COMBINED ANTIGEN AND ANTIBODY - Normal   CBC/DIFF    Narrative:     The following orders were created for panel order CBC/DIFF.  Procedure                               Abnormality          Status                     ---------                               -----------         ------                     CBC WITH [id number]                                    Final result                 Please view results for these tests on the individual orders.   CBC WITH DIFF       Imaging:  Results for orders placed or performed during the hospital encounter of 08/29/21   XR AP MOBILE CHEST     Status: None (Preliminary result)    Narrative    XR AP MOBILE CHEST performed on 08/29/2021 9:37 PM    INDICATION: 51 years old Male  Chest Pain    TECHNIQUE: 1 view(s) of the chest; 1 image(s)    COMPARISON: CTA chest 08/29/2021.    FINDINGS: The heart size is normal. Scattered linear atelectasis is noted. No focal consolidation, pleural effusion, or pneumothorax.        Impression    Scattered linear atelectasis.     CT ANGIO CHEST FOR PULMONARY EMBOLUS W IV CONTRAST  Status: None (Preliminary result)    Narrative    Ronda Regas  Male, 51 years old.    CT ANGIO CHEST FOR PULMONARY EMBOLUS W IV CONTRAST performed on 08/29/2021 9:45 PM.    REASON FOR EXAM:  chest pain, hx of PE    RADIATION DOSE: 450.22 mGy.cm    COMPARISON: CTA chest 12/26/2019.    FINDINGS:    Lungs/Airways/Pleura: Scattered atelectasis is present. There are scattered ground glass opacities which are favored to be representative of atelectasis and respiratory motion artifact. The central airways is patent. No pneumothorax or pleural effusion.    Heart and Great Vessels: The heart is normal in size. The pulmonary trunk is well opacified with contrast. There is no pulmonary embolism. No pericardial effusion is present.    Mediastinum: No mediastinal mass is present.    Upper Abdomen: No acute process is detected within the upper abdomen.    Other: The vertebral body and intervertebral disc heights are fairly well-maintained.        Impression    No pulmonary embolism.          *Any labs or imaging performed following the conclusion of my care for the  patient, or performed following the patient leaving the ED, was not necessarily reviewed by myself or used for MDM.     Orders:  Orders Placed This Encounter   . XR AP MOBILE CHEST   . CT ANGIO CHEST FOR PULMONARY EMBOLUS W IV CONTRAST   . CBC/DIFF   . BASIC METABOLIC PANEL, NON-FASTING   . TROPONIN-I (FOR ED ONLY)   . CBC WITH DIFF   . HIV1/HIV2 SCREEN, COMBINED ANTIGEN AND ANTIBODY   . ECG 12-LEAD   . INSERT & MAINTAIN PERIPHERAL IV ACCESS   . PERIPHERAL IV DRESSING CHANGE   . NS flush syringe   . NS flush syringe   . NS 250 mL flush bag   . D5W 250 mL flush bag   . iopamidol (ISOVUE-370) 76% infusion     I am scribing for, and in the presence of, Lionel December, MD for services provided on 08/29/2021.   Tillman Abide, SCRIBE      I personally performed the services described in this documentation, as scribed  in my presence, and it is both accurate  and complete.    / Lionel December, MD  Emergency Medicine Resident  [facility]      *Parts of this patients chart were completed in a retrospective fashion due to simultaneous direct patient care activities in the Emergency Department.   *This note was partially generated using MModal Fluency Direct system, and there may be some incorrect words, spellings, and punctuation that were not noted in checking the note before saving.

## 2021-08-29 NOTE — ED Nurses Note (Signed)
ED service at bedside

## 2021-08-29 NOTE — ED Triage Notes (Signed)
[facility] - Emergency Department   Provider in Triage Note     Patient Name: Jon Murphy  Patient MRN: [id number]  Date and Time of Assessment: 08/29/2021 20:45     Chief Complaint   Patient presents with   . Chest Pain      Patient states left side chest pain started 7am and radiates to his left shoulder and back. States pain started in back and radiated to chest. Has PMH of PE's 2 years ago. Patient states he had multiple air flights this week and driving.        Brief HPI: CP with radiation to Left shoulder and back.  Hx of PE    Physical Exam: awake alert NAD    Preliminary Plan:  Labs ordered  EKG ordered  Imaging ordered  Patient will return to waiting room      Aline August, [place]  08/29/2021, 20:45

## 2021-08-29 NOTE — ED Attending Note (Signed)
08/29/2021  Please see my attestation to the ED Primary Note for additional documentation of this encounter.    Jon Murphy is a 51 y.o. male p/w   Chief Complaint   Patient presents with   . Chest Pain      Patient states left side chest pain started 7am and radiates to his left shoulder and back. States pain started in back and radiated to chest. Has PMH of PE's 2 years ago. Patient states he had multiple air flights this week and driving.      Filed Vitals:    08/29/21 2028   BP: (!) 168/96   Pulse: 65   Resp: 18   Temp: 36.6 C (97.9 F)   SpO2: 97%       I was physically present and directly supervised this patient's care. Patient was seen and examined. The midlevel's/resident's history and exam and note were reviewed. I have discussed the case with the resident/mid level provider. I have personally performed a history, physical exam, and my own medical decision making. Key elements in addition to and/or correction of that documentation are as above.

## 2021-08-29 NOTE — ED Nurses Note (Signed)
Pt presented to [facility] ED with CC CP. Endorses achy L-sided, upper back pain that radiates into his lungs. Endorses dizziness/lightheadedness. Endorses some intermittent numbness/tingling in lower extremities. States he has been traveling the past week. Has hx 2 PEs. Denies use of blood thinners. PMH HTN. Denies N/V/D, recent infection, HA. Pt currently resting in stretcher with wheels locked and in low position. Call bell in reach. Cardiac monitor in place.

## 2021-08-29 NOTE — ED Nurses Note (Signed)
Patient given discharge instructions. Verbalizes understanding and denies any questions or concerns at this time. PIV removed without difficulty, catheter intact upon removal, pressure applied to prevent bleeding. Patient to waiting room at this time.

## 2021-08-30 DIAGNOSIS — Z86711 Personal history of pulmonary embolism: Secondary | ICD-10-CM

## 2021-08-30 DIAGNOSIS — R918 Other nonspecific abnormal finding of lung field: Secondary | ICD-10-CM

## 2021-08-30 DIAGNOSIS — J9811 Atelectasis: Secondary | ICD-10-CM

## 2021-08-30 DIAGNOSIS — R079 Chest pain, unspecified: Secondary | ICD-10-CM

## 2021-08-30 DIAGNOSIS — R160 Hepatomegaly, not elsewhere classified: Secondary | ICD-10-CM

## 2022-08-23 ENCOUNTER — Ambulatory Visit (HOSPITAL_BASED_OUTPATIENT_CLINIC_OR_DEPARTMENT_OTHER): Payer: Self-pay | Admitting: Student in an Organized Health Care Education/Training Program

## 2022-11-23 ENCOUNTER — Other Ambulatory Visit: Payer: Self-pay

## 2022-11-23 ENCOUNTER — Ambulatory Visit: Payer: Self-pay

## 2022-11-23 DIAGNOSIS — Z0000 Encounter for general adult medical examination without abnormal findings: Secondary | ICD-10-CM

## 2022-11-23 LAB — COMPREHENSIVE METABOLIC PNL, FASTING
ALBUMIN: 4.1 g/dL (ref 3.5–5.0)
ALKALINE PHOSPHATASE: 66 U/L (ref 45–115)
ALT (SGPT): 20 U/L (ref 10–55)
ANION GAP: 10 mmol/L (ref 4–13)
AST (SGOT): 20 U/L (ref 8–45)
BILIRUBIN TOTAL: 0.9 mg/dL (ref 0.3–1.3)
BUN/CREA RATIO: 15 (ref 6–22)
BUN: 16 mg/dL (ref 8–25)
CALCIUM: 9.3 mg/dL (ref 8.6–10.2)
CHLORIDE: 106 mmol/L (ref 96–111)
CO2 TOTAL: 24 mmol/L (ref 22–30)
CREATININE: 1.07 mg/dL (ref 0.75–1.35)
ESTIMATED GFR - MALE: 84 mL/min/BSA (ref >=60)
GLUCOSE: 91 mg/dL (ref 70–99)
POTASSIUM: 4.2 mmol/L (ref 3.5–5.1)
PROTEIN TOTAL: 7.2 g/dL (ref 6.0–7.9)
SODIUM: 140 mmol/L (ref 136–145)

## 2022-11-23 LAB — LIPID PANEL
CHOL/HDL RATIO: 4.7
CHOLESTEROL: 245 mg/dL — ABNORMAL HIGH (ref 100–200)
HDL CHOL: 52 mg/dL (ref >=50)
LDL CALC: 172 mg/dL — ABNORMAL HIGH (ref <100)
NON-HDL: 193 mg/dL — ABNORMAL HIGH (ref <=190)
TRIGLYCERIDES: 118 mg/dL (ref <150)
VLDL CALC: 23 mg/dL (ref <30)

## 2022-11-23 LAB — PSA SCREENING: PSA: 0.61 ng/mL (ref <=4.00)

## 2022-11-27 ENCOUNTER — Ambulatory Visit
Payer: Self-pay | Attending: Student in an Organized Health Care Education/Training Program | Admitting: Student in an Organized Health Care Education/Training Program

## 2022-11-27 ENCOUNTER — Encounter (HOSPITAL_BASED_OUTPATIENT_CLINIC_OR_DEPARTMENT_OTHER): Payer: Self-pay | Admitting: Student in an Organized Health Care Education/Training Program

## 2022-11-27 ENCOUNTER — Other Ambulatory Visit: Payer: Self-pay

## 2022-11-27 VITALS — BP 120/62 | HR 79 | Temp 98.5°F | Ht 69.0 in | Wt 231.7 lb

## 2022-11-27 DIAGNOSIS — I10 Essential (primary) hypertension: Secondary | ICD-10-CM | POA: Insufficient documentation

## 2022-11-27 DIAGNOSIS — Z0000 Encounter for general adult medical examination without abnormal findings: Secondary | ICD-10-CM | POA: Insufficient documentation

## 2022-11-27 DIAGNOSIS — Z5320 Procedure and treatment not carried out because of patient's decision for unspecified reasons: Secondary | ICD-10-CM | POA: Insufficient documentation

## 2022-11-27 DIAGNOSIS — Z801 Family history of malignant neoplasm of trachea, bronchus and lung: Secondary | ICD-10-CM | POA: Insufficient documentation

## 2022-11-27 DIAGNOSIS — Z5989 Other problems related to housing and economic circumstances: Secondary | ICD-10-CM | POA: Insufficient documentation

## 2022-11-27 MED ORDER — LOSARTAN 50 MG TABLET
50.0000 mg | ORAL_TABLET | Freq: Every day | ORAL | 3 refills | Status: DC
Start: 2022-11-27 — End: 2024-04-30

## 2022-11-27 NOTE — H&P (Signed)
Department of Family Medicine   History and Physical      Jon Murphy  MRN: [id number]  DOB: 1971-02-01  Date of Service: 11/27/2022    CHIEF COMPLAINT  Chief Complaint   Patient presents with    Establish Care       HISTORY OF PRESENT ILLNESS  Jon Murphy is a 52 y.o. male presenting to clinic to establish care.  Previously followed in our clinic with Dr.Mersing.    Hypertension:  Has been taking 50 mg of losartan daily without side effects.  Denies any chest pain, dyspnea, headaches, or dizziness.  Would like to try titrating off of the losartan.      History of pulmonary embolism: Reports 2 prior histories of PEs.  First was considered unprovoked and he reportedly had a full hypercoagulability workup which was largely unremarkable per patient.  Had a 2nd PE that was thought to be secondary to COVID-19.  Has been off of blood thinners for some time now.    Socially, he lives with his wife and 3 sons.  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  Denies any tobacco, alcohol, or illicit drug use.      PAST MEDICAL HISTORY  Past Medical History:   Diagnosis Date    Essential hypertension, benign     Pulmonary embolism (CMS HCC)            MEDICATIONS  multivit-minerals/folic acid (ADULT MULTIVITAMIN GUMMIES ORAL), Take 1 Tablet by mouth Once a day  losartan (COZAAR) 50 mg Oral Tablet, Take 1 Tablet (50 mg total) by mouth Once a day    No facility-administered medications prior to visit.      ALLERGIES  No Known Allergies    PAST SURGICAL HISTORY  Past Surgical History:   Procedure Laterality Date    HX WISDOM TEETH EXTRACTION             IMMUNIZATIONS  Immunization History   Administered Date(s) Administered    Covid-19 Vaccine,Janssen(J&J) 03/29/2020    Diptheria & Tetanus Toxoid,Adsorbed, (Decavac/Tenivac) 47YRS & OLDER 06/25/2008    INFLUENZA VIRUS VACCINE (ADMIN) 05/27/2016    Tetanus Toxoid/Diphtheria Toxoid/Acellular Pertussis Vaccine, Adsorbed 11/15/2020       FAMILY HISTORY  Family Medical  History:       Problem Relation (Age of Onset)    Coronary Artery Disease Father    Diabetes Mother    Healthy Son, Son, Son    Lung Cancer Mother, Maternal Grandmother              SOCIAL HISTORY  Social History     Socioeconomic History    Marital status: Married    Number of children: 3   Occupational History    Occupation: Youth worker   Tobacco Use    Smoking status: Never    Smokeless tobacco: Never   Vaping Use    Vaping status: Never Used   Substance and Sexual Activity    Alcohol use: Not Currently    Drug use: Never    Sexual activity: Yes     Partners: Female   Social History Narrative    Originally from [place], [place]. Moved to [place] in 2019. 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 Lives with wife and 3 children.        REVIEW OF SYSTEMS  Positive ROS discussed in HPI, otherwise all other systems negative.      PHYSICAL EXAM  Vitals: Blood pressure  120/62, pulse 79, temperature 36.9 C (98.5 F), temperature source Thermal Scan, height 1.753 m (5\' 9" ), weight 105 kg (231 lb 11.3 oz), SpO2 96%. Body mass index is 34.22 kg/m.  General: appears stated age, no acute distress  HEENT: conjunctiva clear; pupils equal and round; normal appearing TMs bilaterally; mouth mucus membranes moist; pharynx appears normal without exudate or erythema  Neck: no thyromegaly or lymphadenopathy  Cardiovascular: RRR, no murmur  Lungs: clear to auscultation bilaterally  Abdomen: soft, non-tender, bowel sounds normal  Genito-urinary: Deferred  Extremities: no cyanosis or edema  Skin: no rashes or lesions  Neurologic: gait normal, CN 2-12 grossly intact, AOx3  Psychiatric: normal affect and behavior    ASSESSMENT AND PLAN  (Z00.00) Encounter for medical examination to establish care  (primary encounter diagnosis)  Plan:  EMR reviewed updated.  Counseled on healthy lifestyle, diet, and exercise    (I10) Hypertension, unspecified type  Plan: losartan (COZAAR) 50 mg Oral  Tablet  Chronic, controlled   -May decrease losartan to 25 mg daily for 1 month, recommended monitoring home blood pressures and may discontinue if blood pressure remains well controlled    (Z80.1) Family history of neoplasm of lung  Plan: Refer to [facility]    (Z59.89) Does not have health insurance, (Z53.20) Colonoscopy refused      Health Maintanence    Depression screening is negative. PHQ 2 Total: 0       BMI Screening (yearly 52 yo+)  Appropriate range based off of the patient's age is 23.0-29.9 (52 yo+) 18.5-24.9 (age 96-64 yo).   BMI addressed: Advised on diet, weight loss, and exercise to reduce above normal BMI.    Wt Readings from Last 3 Encounters:   11/27/22 105 kg (231 lb 11.3 oz)   08/29/21 110 kg (241 lb 10 oz)   11/15/20 114 kg (250 lb 10.6 oz)        Type II DM Screening  (yearly 33-70 yo, screen earlier if + risk factors)  Fasting glucose within normal range on CMP    Blood Pressure Screening (yearly 52 yo+)  Goal is < 150/90 (> 60 yo), < 140/90 (< 60 yo), < 120/70 (Medicare).   Elevated Blood Pressure Plan of Care:  Patient already has a diagnosis of hypertension. Will be treated as appropriate.        Dyslipidemia Screening (yearly M 20-35 yo risk, 52 yo+, W 21-45 yo risk, 52 yo+)  Lab Results   Component Value Date    CHOLESTEROL 245 (H) 11/23/2022    LDLCHOL 172 (H) 11/23/2022    TRIG 118 11/23/2022       Colorectal Cancer Screening (22-75 yo)  Declines colonoscopy as patient lacks health insurance      Prostate Cancer Screening (every 2+ yrs < 55 if high risk, 44-69 yo)  PROSTATE SPECIFIC ANTIGEN   Lab Results   Component Value Date/Time    PROSSPECAG 0.61 11/23/2022 10:10 AM            Orders Placed This Encounter    Refer to [facility]    losartan (COZAAR) 50 mg Oral Tablet       Return in about 6 months (around 05/29/2023), or if symptoms worsen or fail to improve.      Marchelle Gearing, MD 11/27/2022, 13:41

## 2022-11-29 ENCOUNTER — Encounter (HOSPITAL_BASED_OUTPATIENT_CLINIC_OR_DEPARTMENT_OTHER): Payer: Self-pay

## 2023-01-04 ENCOUNTER — Ambulatory Visit: Payer: Self-pay | Admitting: FAMILY MEDICINE

## 2023-01-04 ENCOUNTER — Encounter (HOSPITAL_BASED_OUTPATIENT_CLINIC_OR_DEPARTMENT_OTHER): Payer: Self-pay | Admitting: FAMILY MEDICINE

## 2023-01-04 ENCOUNTER — Other Ambulatory Visit: Payer: Self-pay

## 2023-01-04 VITALS — BP 126/78 | HR 62 | Temp 97.9°F | Ht 69.0 in | Wt 231.3 lb

## 2023-01-04 DIAGNOSIS — B029 Zoster without complications: Secondary | ICD-10-CM

## 2023-01-04 MED ORDER — VALACYCLOVIR 1 GRAM TABLET: 1000.0000 mg | ORAL_TABLET | Freq: Three times a day (TID) | ORAL | 0 refills | Status: AC

## 2023-01-04 NOTE — Progress Notes (Signed)
Department of Family Medicine   Progress Note    [name]  MRN: [id number]  DOB: [birth year]  Date of Service: 01/04/2023    CHIEF COMPLAINT  Chief Complaint   Patient presents with    Shingles       SUBJECTIVE  Jon Murphy is a 52 y.o. male who presents to clinic for acute visit.     Rash: Right sided started Tuesday morning (two days prior) rash started. Pulled muscle pain started night before Monday night. Located right sided back and front. Think nerve related and shingles. Non-pruritic. Stabby pain. Used an antibiotic cream neosporin. No relief. No prior hx of shingles. Non-vaccinated.       PAST MEDICAL HISTORY  Past Medical History:   Diagnosis Date    Essential hypertension, benign     Pulmonary embolism (CMS HCC)        Medications:   Ascorbic Acid 1,500 mg Oral Tablet Sustained Release, Take by mouth  chromium aa che-manganese-zinc 200-5-25 mcg-mg-mg Oral Tablet, Take by mouth  losartan (COZAAR) 50 mg Oral Tablet, Take 1 Tablet (50 mg total) by mouth Once a day  multivit-minerals/folic acid (ADULT MULTIVITAMIN GUMMIES ORAL), Take 1 Tablet by mouth Once a day    No facility-administered medications prior to visit.      Allergies:   No Known Allergies    Review of Systems:  All other ROS negative excluding HPI    OBJECTIVE  BP 126/78   Pulse 62   Temp 36.6 C (97.9 F) (Thermal Scan)   Ht 1.753 m (5\' 9" )   Wt 105 kg (231 lb 4.2 oz)   SpO2 97%   BMI 34.15 kg/m   General: no distress  Lungs: non-labored breathing   Cardiovascular: well perfused  Abdomen: non-distended  Extremities: no cyanosis or edema  Skin: Dry, well demarcated erythematous patches with centrally located vesicles located right thoracic approximately following T1/2 dermatome  Neurologic: gait is normal, AOx3, CN 2-12 grossly intact  Psychiatric: normal affect and behavior          Patient Active Problem List   Diagnosis    Pulmonary embolism (CMS HCC)    Hypertension    Does not have health insurance    Colonoscopy refused         ASSESSMENT/PLAN  Jon Murphy is a 51 y.o. male with PMH of HTN, VTE who presents to clinic for acute painful right sided rash that is vesicular following dermotome pattern consistent with likely acute local herpes zoster infxn.     (B02.9) Herpes zoster infection  (primary encounter diagnosis)  - Acute  - Not immunocompromised   Plan: Will treat with valACYclovir (VALTREX) 1 gram Oral Tablet TID for 10 days   - Patient can used topical lidocaine to help with analgesic effect   - Consider gabapentin if pain persists   - Advised frequent handwashing   - Return if symptoms worsen or progress despite above therapy     Orders Placed This Encounter    valACYclovir (VALTREX) 1 gram Oral Tablet     Return if symptoms worsen or fail to improve.    Janell Quiet, MD 01/04/2023, 08:51  PGY-3 Family Medicine Resident     This note was partially generated using [employer] Fluency Direct system. There may be some incorrect words, spellings, and punctuation as a result that were not noted in checking the note before saving.        I saw and examined the patient.  I reviewed the resident's  note.  I agree with the findings and plan of care as documented in the resident's note.  Any exceptions/additions are edited/noted.    Martha Clan, DO

## 2023-02-08 ENCOUNTER — Ambulatory Visit (HOSPITAL_BASED_OUTPATIENT_CLINIC_OR_DEPARTMENT_OTHER): Payer: Self-pay

## 2023-02-28 ENCOUNTER — Encounter (HOSPITAL_BASED_OUTPATIENT_CLINIC_OR_DEPARTMENT_OTHER): Payer: Self-pay

## 2023-03-01 ENCOUNTER — Ambulatory Visit (HOSPITAL_BASED_OUTPATIENT_CLINIC_OR_DEPARTMENT_OTHER): Payer: Self-pay

## 2023-03-15 ENCOUNTER — Ambulatory Visit (HOSPITAL_BASED_OUTPATIENT_CLINIC_OR_DEPARTMENT_OTHER): Payer: Self-pay

## 2023-05-31 ENCOUNTER — Ambulatory Visit (HOSPITAL_BASED_OUTPATIENT_CLINIC_OR_DEPARTMENT_OTHER): Payer: Self-pay | Admitting: Student in an Organized Health Care Education/Training Program

## 2024-01-16 ENCOUNTER — Encounter (HOSPITAL_BASED_OUTPATIENT_CLINIC_OR_DEPARTMENT_OTHER): Payer: Self-pay

## 2024-02-07 ENCOUNTER — Ambulatory Visit (HOSPITAL_BASED_OUTPATIENT_CLINIC_OR_DEPARTMENT_OTHER): Payer: Self-pay

## 2024-04-26 ENCOUNTER — Other Ambulatory Visit (HOSPITAL_BASED_OUTPATIENT_CLINIC_OR_DEPARTMENT_OTHER): Payer: Self-pay | Admitting: Student in an Organized Health Care Education/Training Program

## 2024-04-26 DIAGNOSIS — I10 Essential (primary) hypertension: Secondary | ICD-10-CM

## 2024-09-04 ENCOUNTER — Encounter (HOSPITAL_BASED_OUTPATIENT_CLINIC_OR_DEPARTMENT_OTHER): Payer: Self-pay | Admitting: Student in an Organized Health Care Education/Training Program
